# Patient Record
Sex: Male | Born: 1970 | Race: White | Hispanic: No | Marital: Single | Smoking: Current every day smoker
Health system: Southern US, Community
[De-identification: ages and names within clinical notes are randomized; demographics above are authoritative.]

## PROBLEM LIST (undated history)

## (undated) DIAGNOSIS — I10 Essential (primary) hypertension: Secondary | ICD-10-CM

## (undated) DIAGNOSIS — I2510 Atherosclerotic heart disease of native coronary artery without angina pectoris: Secondary | ICD-10-CM

## (undated) DIAGNOSIS — I2699 Other pulmonary embolism without acute cor pulmonale: Secondary | ICD-10-CM

## (undated) DIAGNOSIS — S8991XA Unspecified injury of right lower leg, initial encounter: Secondary | ICD-10-CM

## (undated) HISTORY — DX: Atherosclerotic heart disease of native coronary artery without angina pectoris: I25.10

## (undated) HISTORY — DX: Unspecified injury of right lower leg, initial encounter: S89.91XA

## (undated) HISTORY — DX: Other pulmonary embolism without acute cor pulmonale: I26.99

---

## 2000-11-28 ENCOUNTER — Emergency Department (HOSPITAL_COMMUNITY): Admission: EM | Admit: 2000-11-28 | Discharge: 2000-11-28 | Payer: Self-pay | Admitting: Emergency Medicine

## 2003-09-22 ENCOUNTER — Emergency Department (HOSPITAL_COMMUNITY): Admission: EM | Admit: 2003-09-22 | Discharge: 2003-09-22 | Payer: Self-pay | Admitting: Emergency Medicine

## 2004-12-12 ENCOUNTER — Emergency Department (HOSPITAL_COMMUNITY): Admission: EM | Admit: 2004-12-12 | Discharge: 2004-12-12 | Payer: Self-pay | Admitting: Emergency Medicine

## 2012-03-27 ENCOUNTER — Emergency Department (HOSPITAL_COMMUNITY): Payer: Managed Care, Other (non HMO)

## 2012-03-27 ENCOUNTER — Inpatient Hospital Stay (HOSPITAL_COMMUNITY)
Admission: EM | Admit: 2012-03-27 | Discharge: 2012-03-30 | DRG: 176 | Disposition: A | Discharge status: Home / Self Care | Payer: Managed Care, Other (non HMO) | Attending: Internal Medicine | Admitting: Internal Medicine

## 2012-03-27 ENCOUNTER — Encounter (HOSPITAL_COMMUNITY): Payer: Self-pay

## 2012-03-27 DIAGNOSIS — I2699 Other pulmonary embolism without acute cor pulmonale: Principal | ICD-10-CM

## 2012-03-27 DIAGNOSIS — F1010 Alcohol abuse, uncomplicated: Secondary | ICD-10-CM

## 2012-03-27 DIAGNOSIS — M19012 Primary osteoarthritis, left shoulder: Secondary | ICD-10-CM

## 2012-03-27 DIAGNOSIS — Z720 Tobacco use: Secondary | ICD-10-CM

## 2012-03-27 DIAGNOSIS — F17200 Nicotine dependence, unspecified, uncomplicated: Secondary | ICD-10-CM | POA: Diagnosis present

## 2012-03-27 DIAGNOSIS — M19019 Primary osteoarthritis, unspecified shoulder: Secondary | ICD-10-CM | POA: Diagnosis present

## 2012-03-27 LAB — BASIC METABOLIC PANEL
BUN: 15 mg/dL (ref 6–23)
Calcium: 8.9 mg/dL (ref 8.4–10.5)
Creatinine, Ser: 0.86 mg/dL (ref 0.50–1.35)
GFR calc Af Amer: >90 mL/min (ref >90)
GFR calc non Af Amer: >90 mL/min (ref >90)
Glucose, Bld: 102 mg/dL — ABNORMAL HIGH (ref 70–99)
Sodium: 136 mEq/L (ref 135–145)

## 2012-03-27 LAB — D-DIMER, QUANTITATIVE: D-Dimer, Quant: 0.7 ug/mL-FEU — ABNORMAL HIGH (ref 0.00–0.48)

## 2012-03-27 LAB — CBC WITH DIFFERENTIAL/PLATELET
Eosinophils Absolute: 0.1 10*3/uL (ref 0.0–0.7)
Hemoglobin: 15.4 g/dL (ref 13.0–17.0)
Lymphs Abs: 2.5 10*3/uL (ref 0.7–4.0)
MCH: 34.1 pg — ABNORMAL HIGH (ref 26.0–34.0)
Monocytes Relative: 6 % (ref 3–12)
Neutrophils Relative %: 76 % (ref 43–77)
RBC: 4.52 MIL/uL (ref 4.22–5.81)
WBC: 14.6 10*3/uL — ABNORMAL HIGH (ref 4.0–10.5)

## 2012-03-27 LAB — PROTIME-INR: Prothrombin Time: 11.9 seconds (ref 11.6–15.2)

## 2012-03-27 LAB — APTT: aPTT: 28 seconds (ref 24–37)

## 2012-03-27 MED ORDER — IOHEXOL 350 MG/ML SOLN
100.0000 mL | Freq: Once | INTRAVENOUS | Status: AC | PRN
  Administered 2012-03-27: 100 mL via INTRAVENOUS

## 2012-03-27 MED ORDER — SODIUM CHLORIDE 0.9 % IV BOLUS (SEPSIS)
500.0000 mL | Freq: Once | INTRAVENOUS | Status: AC
  Administered 2012-03-27: 500 mL via INTRAVENOUS

## 2012-03-27 MED ORDER — OXYCODONE-ACETAMINOPHEN 5-325 MG PO TABS
1.0000 | ORAL_TABLET | Freq: Once | ORAL | Status: AC
  Administered 2012-03-27: 1 via ORAL
  Filled 2012-03-27: qty 1

## 2012-03-27 NOTE — ED Provider Notes (Signed)
History   This chart was scribed for [employer]. Rubin Payor, MD by Leone Payor, ED Scribe. This patient was seen in room APA10/[facility]10 and the patient's care was started at 2023.   CSN: [id number]  Arrival date & time 03/27/12  2005   First MD Initiated Contact with Patient 03/27/12 2023      Chief Complaint  Patient presents with  . Chest Pain     The history is provided by the patient. No language interpreter was used.    Thomas Mckenzie is a 41 y.o. male who presents to the Emergency Department complaining of a new, unchanged, constant,  moderate right sided chest pain starting about 12 hours ago. Pt denies falling, any injuries, or any strenuous activities prior to onset of pain. He has not had similar symptoms in the past. Pt states that pain is aggravated by deep breaths. He denies any cough, fever, swelling in the extremities.      Pt is a current everyday smoker and occasional alcohol user. History reviewed. No pertinent past medical history.  History reviewed. No pertinent past surgical history.  History reviewed. No pertinent family history.  History  Substance Use Topics  . Smoking status: Current Every Day Smoker -- 2.0 packs/day    Types: Cigarettes  . Smokeless tobacco: Not on file  . Alcohol Use: Yes      Review of Systems  Constitutional: Negative.  Negative for fever.  HENT: Negative.   Respiratory: Negative for cough.   Cardiovascular: Positive for chest pain (right sided). Negative for leg swelling.  Gastrointestinal: Negative.   Musculoskeletal: Negative.   Skin: Negative.   Neurological: Negative.   Hematological: Negative.   Psychiatric/Behavioral: Negative.     Allergies  Review of patient's allergies indicates no known allergies.  Home Medications   Current Outpatient Rx  Name  Route  Sig  Dispense  Refill  . HYDROCODONE-ACETAMINOPHEN 7.5-325 MG PO TABS   Oral   Take 1 tablet by mouth every 6 (six) hours as needed. Pain         .  NAPROXEN 500 MG PO TABS   Oral   Take 500 mg by mouth 2 (two) times daily.         Marland Kitchen PRESCRIPTION MEDICATION   Injection   Inject as directed once. Cortisone injection in left shoulder done by Dr. Hilda Lias on Dec 3.           BP 156/94  Pulse 90  Temp 98.4 F (36.9 C) (Oral)  Resp 22  Ht 5\' 10"  (1.778 m)  Wt 190 lb (86.183 kg)  BMI 27.26 kg/m2  SpO2 98%  Physical Exam  Nursing note and vitals reviewed. Constitutional: He appears well-developed and well-nourished.  HENT:  Head: Normocephalic and atraumatic.  Eyes: Conjunctivae normal are normal. Pupils are equal, round, and reactive to light.  Neck: Neck supple. No tracheal deviation present. No thyromegaly present.  Cardiovascular: Normal rate, regular rhythm and normal heart sounds.  Exam reveals no friction rub.   No murmur heard.      Heart is normal, no murmurs or rub.   Pulmonary/Chest: Effort normal and breath sounds normal. He exhibits no tenderness.       Lungs are clear, no wheezes, rales. No chest tenderness.   Abdominal: Soft. Bowel sounds are normal. He exhibits no distension. There is no tenderness.  Musculoskeletal: Normal range of motion. He exhibits no edema and no tenderness.       No edema to the extremities.  Neurological: He is alert. Coordination normal.  Skin: Skin is warm and dry. No rash noted.       No rash.   Psychiatric: He has a normal mood and affect.    ED Course  Procedures (including critical care time)   COORDINATION OF CARE:  8:30 PM Discussed treatment plan which includes blood work and x-ray with pt at bedside and pt agreed to plan.  Results for orders placed during the hospital encounter of 03/27/12  D-DIMER, QUANTITATIVE      Component Value Range   D-Dimer, Quant 0.70 (*) 0.00 - 0.48 ug/mL-FEU   Dg Chest 2 View  03/27/2012  *RADIOLOGY REPORT*  Clinical Data: Right-sided chest pain.  CHEST - 2 VIEW  Comparison: None.  Findings: Lungs are hypoinflated without  consolidation or effusion. Cardiomediastinal silhouette, bones and soft tissues are within normal.  IMPRESSION: No acute cardiopulmonary disease.   Original Report Authenticated By: Elberta Fortis, M.D.       Labs Reviewed  D-DIMER, QUANTITATIVE - Abnormal; Notable for the following:    D-Dimer, Quant 0.70 (*)     All other components within normal limits  CBC WITH DIFFERENTIAL  BASIC METABOLIC PANEL  PROTIME-INR  APTT  ANTITHROMBIN III  PROTEIN C ACTIVITY  PROTEIN C, TOTAL  PROTEIN S ACTIVITY  PROTEIN S, TOTAL  LUPUS ANTICOAGULANT PANEL  BETA-2-GLYCOPROTEIN I ABS, IGG/M/A  HOMOCYSTEINE  FACTOR 5 LEIDEN  CARDIOLIPIN ANTIBODIES, IGG, IGM, IGA   Dg Chest 2 View  03/27/2012  *RADIOLOGY REPORT*  Clinical Data: Right-sided chest pain.  CHEST - 2 VIEW  Comparison: None.  Findings: Lungs are hypoinflated without consolidation or effusion. Cardiomediastinal silhouette, bones and soft tissues are within normal.  IMPRESSION: No acute cardiopulmonary disease.   Original Report Authenticated By: Elberta Fortis, M.D.    Ct Angio Chest W/cm &/or Wo Cm  03/27/2012  *RADIOLOGY REPORT*  Clinical Data:  Right chest pain, elevated D-dimer, increasing pain with deep inspiration, past history smoking  CT ANGIOGRAPHY CHEST  Technique:  Multidetector CT imaging of the chest using the standard protocol during bolus administration of intravenous contrast. Multiplanar reconstructed images including MIPs were obtained and reviewed to evaluate the vascular anatomy.  Contrast: OMNIPAQUE IOHEXOL 350 MG/ML SOLN  Comparison: None  Findings: Aorta normal caliber without aneurysm or dissection. Visualized portion upper abdomen unremarkable. No thoracic adenopathy. Filling defect identified within a right middle lobe pulmonary artery compatible pulmonary embolism. No other definite pulmonary emboli visualized. Scattered atelectasis right lower lobe. Peripheral infiltrate in the right middle lobe within the segment  served by the pulmonary embolus question infarct. Remaining lungs clear. No pleural effusion or pneumothorax. No acute osseous findings.  IMPRESSION: Pulmonary embolus identified within a right middle lobe pulmonary artery. Associated infiltrate in the same segment of the right middle lobe question infarct.  Critical Value/emergent results were called by telephone at the time of interpretation on 03/27/2012 at 2253 hours to Dr. Rubin Payor, who verbally acknowledged these results.   Original Report Authenticated By: Ulyses Southward, M.D.      1. Pulmonary embolism       MDM  Patient with right-sided chest pain. Acute onset. It is pleuritic. He is not hypoxic. D-dimer was positive. CT angiography she showed a pulmonary embolism. Also possible infarct. Patient be admitted to medicine. His primary care doctor the has not seen yet. He had a recent left shoulder injection also.      I personally performed the services described in this documentation, which was scribed in  my presence. The recorded information has been reviewed and is accurate.     Juliet Rude. Rubin Payor, MD 03/27/12 2308

## 2012-03-27 NOTE — ED Notes (Signed)
Pt c/o right sided chest pain that increases with deep breath.

## 2012-03-28 ENCOUNTER — Encounter (HOSPITAL_COMMUNITY): Payer: Self-pay | Admitting: Internal Medicine

## 2012-03-28 DIAGNOSIS — M19019 Primary osteoarthritis, unspecified shoulder: Secondary | ICD-10-CM

## 2012-03-28 DIAGNOSIS — F17200 Nicotine dependence, unspecified, uncomplicated: Secondary | ICD-10-CM

## 2012-03-28 DIAGNOSIS — I2699 Other pulmonary embolism without acute cor pulmonale: Secondary | ICD-10-CM | POA: Diagnosis present

## 2012-03-28 DIAGNOSIS — F1010 Alcohol abuse, uncomplicated: Secondary | ICD-10-CM

## 2012-03-28 DIAGNOSIS — Z720 Tobacco use: Secondary | ICD-10-CM | POA: Diagnosis present

## 2012-03-28 DIAGNOSIS — M19012 Primary osteoarthritis, left shoulder: Secondary | ICD-10-CM | POA: Diagnosis present

## 2012-03-28 LAB — BASIC METABOLIC PANEL
BUN: 13 mg/dL (ref 6–23)
CO2: 25 mEq/L (ref 19–32)
Chloride: 104 mEq/L (ref 96–112)
Glucose, Bld: 96 mg/dL (ref 70–99)
Potassium: 3.8 mEq/L (ref 3.5–5.1)
Sodium: 136 mEq/L (ref 135–145)

## 2012-03-28 LAB — CBC
HCT: 39 % (ref 39.0–52.0)
Hemoglobin: 13.7 g/dL (ref 13.0–17.0)
MCH: 33.7 pg (ref 26.0–34.0)
MCHC: 35.1 g/dL (ref 30.0–36.0)
RBC: 4.06 MIL/uL — ABNORMAL LOW (ref 4.22–5.81)

## 2012-03-28 LAB — HEPATIC FUNCTION PANEL
ALT: 25 U/L (ref 0–53)
AST: 20 U/L (ref 0–37)
Alkaline Phosphatase: 101 U/L (ref 39–117)
Bilirubin, Direct: <0.1 mg/dL (ref 0.0–0.3)
Total Bilirubin: 0.5 mg/dL (ref 0.3–1.2)

## 2012-03-28 LAB — URINALYSIS, ROUTINE W REFLEX MICROSCOPIC
Bilirubin Urine: NEGATIVE
Glucose, UA: NEGATIVE mg/dL
Specific Gravity, Urine: 1.01 (ref 1.005–1.030)
pH: 6.5 (ref 5.0–8.0)

## 2012-03-28 LAB — URINE MICROSCOPIC-ADD ON

## 2012-03-28 LAB — HEMOGLOBIN A1C: Hgb A1c MFr Bld: 5.3 % (ref <5.7)

## 2012-03-28 LAB — HOMOCYSTEINE: Homocysteine: 11.9 umol/L (ref 4.0–15.4)

## 2012-03-28 MED ORDER — TAB-A-VITE/IRON PO TABS
1.0000 | ORAL_TABLET | Freq: Every day | ORAL | Status: DC
  Administered 2012-03-28 – 2012-03-30 (×3): 1 via ORAL
  Filled 2012-03-28 (×4): qty 1

## 2012-03-28 MED ORDER — WARFARIN SODIUM 10 MG PO TABS
10.0000 mg | ORAL_TABLET | Freq: Once | ORAL | Status: AC
  Administered 2012-03-28: 10 mg via ORAL
  Filled 2012-03-28: qty 1

## 2012-03-28 MED ORDER — PANTOPRAZOLE SODIUM 40 MG PO TBEC
40.0000 mg | DELAYED_RELEASE_TABLET | Freq: Every day | ORAL | Status: DC
  Administered 2012-03-28 – 2012-03-30 (×3): 40 mg via ORAL
  Filled 2012-03-28 (×3): qty 1

## 2012-03-28 MED ORDER — POTASSIUM CHLORIDE IN NACL 20-0.9 MEQ/L-% IV SOLN
INTRAVENOUS | Status: DC
  Administered 2012-03-28 – 2012-03-29 (×3): via INTRAVENOUS

## 2012-03-28 MED ORDER — ENOXAPARIN SODIUM 120 MG/0.8ML ~~LOC~~ SOLN
120.0000 mg | SUBCUTANEOUS | Status: DC
  Administered 2012-03-28 – 2012-03-30 (×3): 120 mg via SUBCUTANEOUS
  Filled 2012-03-28 (×5): qty 0.8

## 2012-03-28 MED ORDER — HYDROCODONE-ACETAMINOPHEN 5-325 MG PO TABS
1.0000 | ORAL_TABLET | Freq: Four times a day (QID) | ORAL | Status: DC | PRN
Start: 2012-03-28 — End: 2012-03-29
  Administered 2012-03-28 – 2012-03-29 (×2): 1 via ORAL
  Filled 2012-03-28 (×2): qty 1

## 2012-03-28 MED ORDER — SORBITOL 70 % SOLN
30.0000 mL | Freq: Every day | Status: DC | PRN
  Filled 2012-03-28: qty 30

## 2012-03-28 MED ORDER — NAPROXEN 250 MG PO TABS
500.0000 mg | ORAL_TABLET | Freq: Two times a day (BID) | ORAL | Status: DC
  Administered 2012-03-28 (×2): 500 mg via ORAL
  Filled 2012-03-28 (×2): qty 2

## 2012-03-28 MED ORDER — WARFARIN VIDEO
Freq: Once | Status: AC
  Administered 2012-03-28: 10:00:00

## 2012-03-28 MED ORDER — ONDANSETRON HCL 4 MG/2ML IJ SOLN: 4.0000 mg | INTRAMUSCULAR | Status: DC | PRN

## 2012-03-28 MED ORDER — LEVOFLOXACIN IN D5W 500 MG/100ML IV SOLN
500.0000 mg | Freq: Once | INTRAVENOUS | Status: AC
  Administered 2012-03-28: 500 mg via INTRAVENOUS
  Filled 2012-03-28: qty 100

## 2012-03-28 MED ORDER — SODIUM CHLORIDE 0.9 % IV SOLN
INTRAVENOUS | Status: AC
  Administered 2012-03-28: 02:00:00 via INTRAVENOUS

## 2012-03-28 MED ORDER — WARFARIN - PHARMACIST DOSING INPATIENT: Freq: Every day | Status: DC

## 2012-03-28 MED ORDER — ENOXAPARIN SODIUM 60 MG/0.6ML ~~LOC~~ SOLN
1.0000 mg/kg | Freq: Once | SUBCUTANEOUS | Status: AC
  Administered 2012-03-28: 90 mg via SUBCUTANEOUS
  Filled 2012-03-28: qty 0.3

## 2012-03-28 MED ORDER — VITAMIN B-1 100 MG PO TABS
100.0000 mg | ORAL_TABLET | Freq: Every day | ORAL | Status: DC
  Administered 2012-03-28 – 2012-03-30 (×3): 100 mg via ORAL
  Filled 2012-03-28 (×3): qty 1

## 2012-03-28 MED ORDER — LEVOFLOXACIN 500 MG PO TABS
500.0000 mg | ORAL_TABLET | Freq: Every day | ORAL | Status: DC
  Administered 2012-03-29 – 2012-03-30 (×2): 500 mg via ORAL
  Filled 2012-03-28 (×2): qty 1

## 2012-03-28 MED ORDER — TRAZODONE HCL 50 MG PO TABS: 50.0000 mg | ORAL_TABLET | Freq: Every evening | ORAL | Status: DC | PRN

## 2012-03-28 MED ORDER — ENOXAPARIN SODIUM 40 MG/0.4ML ~~LOC~~ SOLN: 40.0000 mg | SUBCUTANEOUS | Status: DC

## 2012-03-28 MED ORDER — ONDANSETRON HCL 4 MG PO TABS: 4.0000 mg | ORAL_TABLET | Freq: Four times a day (QID) | ORAL | Status: DC | PRN

## 2012-03-28 MED ORDER — POLYETHYLENE GLYCOL 3350 17 G PO PACK: 17.0000 g | PACK | Freq: Every day | ORAL | Status: DC | PRN

## 2012-03-28 MED ORDER — COUMADIN BOOK
Freq: Once | Status: AC
  Administered 2012-03-28: 12:00:00
  Filled 2012-03-28: qty 1

## 2012-03-28 MED ORDER — LEVOFLOXACIN IN D5W 500 MG/100ML IV SOLN
INTRAVENOUS | Status: AC
  Filled 2012-03-28: qty 100

## 2012-03-28 MED ORDER — MAGNESIUM CITRATE PO SOLN: 1.0000 | Freq: Once | ORAL | Status: AC | PRN

## 2012-03-28 MED ORDER — ACETAMINOPHEN 325 MG PO TABS: 650.0000 mg | ORAL_TABLET | ORAL | Status: DC | PRN

## 2012-03-28 NOTE — ED Notes (Signed)
Dr Orvan Falconer with pt now

## 2012-03-28 NOTE — H&P (Signed)
[facility] History and Physical  Thomas Mckenzie  GEX:[id number]  DOB: 04-07-1971   DOA: 03/28/2012   PCP:   No primary provider on file.  None: Orthopedic surgeon: Dr. Hilda Lias  Chief Complaint:  Right-sided chest pain with breathing since this morning  HPI: Thomas Mckenzie is an 41 y.o. male.   Athletic young Caucasian gentleman, with heavy tobacco and alcohol, considers himself to be in good health until this morning when he developed a sharp low right-sided chest pain aggravated by breathing. He denies cough shortness of breath or palpitations; denies long-distance travel, or any sensory behavior; In fact he has no obvious risk factors for DVT.   CT angiogram in the emergency room revealed right middle lobe tumor embolus with infarct  Smokes 2 packs per day;  Drinks half a gallon of vodka per week; Works as a Electronics engineer  Has been taking naproxen and Percocet for left shoulder arthritis.  Rewiew of Systems:   All systems negative except as marked bold or noted in the HPI;  Constitutional: Negative for malaise, fever and chills. ;  Eyes: Negative for eye pain, redness and discharge. ;  ENMT: Negative for ear pain, hoarseness, nasal congestion, sinus pressure and sore throat. ;  Cardiovascular: Negative for , palpitations, diaphoresis, dyspnea and peripheral edema. ;  Respiratory: Negative for cough, hemoptysis, wheezing and stridor. ;  Gastrointestinal: Negative for nausea, vomiting, diarrhea, constipation, abdominal pain, melena, blood in stool, hematemesis, jaundice and rectal bleeding. unusual weight loss..   Genitourinary: Negative for frequency, dysuria, incontinence,flank pain and hematuria; Musculoskeletal: Negative for back pain and neck pain. Negative for swelling and trauma.;  Skin: . Negative for pruritus, rash, abrasions, bruising and skin lesion.; ulcerations Neuro: Negative for headache, lightheadedness and neck stiffness. Negative for weakness, altered level of  consciousness , altered mental status, extremity weakness, burning feet, involuntary movement, seizure and syncope.  Psych: negative for anxiety, depression, insomnia, tearfulness, panic attacks, hallucinations, paranoia, suicidal or homicidal ideation    History reviewed. No pertinent past medical history.  History reviewed. No pertinent past surgical history.  Medications:  HOME MEDS: Prior to Admission medications   Medication Sig Start Date End Date Taking? Authorizing Provider  HYDROcodone-acetaminophen (NORCO) 7.5-325 MG per tablet Take 1 tablet by mouth every 6 (six) hours as needed. Pain   Yes Historical Provider, MD  naproxen (NAPROSYN) 500 MG tablet Take 500 mg by mouth 2 (two) times daily.   Yes Historical Provider, MD  PRESCRIPTION MEDICATION Inject as directed once. Cortisone injection in left shoulder done by Dr. Hilda Lias on Dec 3.   Yes Historical Provider, MD     Allergies:  No Known Allergies  Social History:   reports that he has been smoking Cigarettes.  He has a 40 pack-year smoking history. He quit smokeless tobacco use about 4 years ago. His smokeless tobacco use included Chew. He reports that he drinks alcohol. He reports that he does not use illicit drugs.  Family History: Family History  Problem Relation Age of Onset  . Cancer Father     liver  . Coronary artery disease Father      Physical Exam: Filed Vitals:   03/27/12 2323 03/27/12 2330 03/28/12 0030 03/28/12 0142  BP: 137/96 140/92 127/87 132/88  Pulse:  75 71 70  Temp:    98.2 F (36.8 C)  TempSrc:    Oral  Resp: 19 23 20 18   Height:    5\' 10"  (1.778 m)  Weight:  SpO2:  96% 95% 98%   Blood pressure 132/88, pulse 70, temperature 98.2 F (36.8 C), temperature source Oral, resp. rate 18, height 5\' 10"  (1.778 m), weight 86.183 kg (190 lb), SpO2 98.00%.  GEN:  Pleasant pleasant young Caucasian gentleman in the stretcher; appears no acute distress, but also seems quite stoic; cooperative  with exam PSYCH:  alert and oriented x4; affect is appropriate. HEENT: Mucous membranes pink and anicteric; PERRLA; EOM intact; no cervical lymphadenopathy nor thyromegaly or carotid bruit; no JVD; Breasts:: Not examined CHEST WALL: No tenderness CHEST: Normal respiration, clear to auscultation bilaterally HEART: Regular rate and rhythm; no murmurs rubs or gallops BACK: No kyphosis or scoliosis; no CVA tenderness ABDOMEN:  soft non-tender; no masses, no organomegaly, normal abdominal bowel sounds; no pannus; no intertriginous candida. Rectal Exam: Not done EXTREMITIES: No bone or joint deformity; age-appropriate arthropathy of the hands and knees; no edema; no ulcerations. Healed abrasions of left calf status post a recent dog bite Genitalia: not examined PULSES: 2+ and symmetric SKIN: Normal hydration no rash or ulceration CNS: Cranial nerves 2-12 grossly intact no focal lateralizing neurologic deficit   Labs on Admission:  Basic Metabolic Panel:  Lab 03/27/12 1324  NA 136  K 3.8  CL 100  CO2 26  GLUCOSE 102*  BUN 15  CREATININE 0.86  CALCIUM 8.9  MG --  PHOS --   Liver Function Tests: No results found for this basename: AST:5,ALT:5,ALKPHOS:5,BILITOT:5,PROT:5,ALBUMIN:5 in the last 168 hours No results found for this basename: LIPASE:5,AMYLASE:5 in the last 168 hours No results found for this basename: AMMONIA:5 in the last 168 hours CBC:  Lab 03/27/12 2305  WBC 14.6*  NEUTROABS 11.1*  HGB 15.4  HCT 43.5  MCV 96.2  PLT 211   Cardiac Enzymes: No results found for this basename: CKTOTAL:5,CKMB:5,CKMBINDEX:5,TROPONINI:5 in the last 168 hours BNP: No components found with this basename: POCBNP:5 D-dimer: No components found with this basename: D-DIMER:5 CBG: No results found for this basename: GLUCAP:5 in the last 168 hours  Radiological Exams on Admission: Dg Chest 2 View  03/27/2012  *RADIOLOGY REPORT*  Clinical Data: Right-sided chest pain.  CHEST - 2 VIEW   Comparison: None.  Findings: Lungs are hypoinflated without consolidation or effusion. Cardiomediastinal silhouette, bones and soft tissues are within normal.  IMPRESSION: No acute cardiopulmonary disease.   Original Report Authenticated By: Elberta Fortis, M.D.    Ct Angio Chest W/cm &/or Wo Cm  03/27/2012  *RADIOLOGY REPORT*  Clinical Data:  Right chest pain, elevated D-dimer, increasing pain with deep inspiration, past history smoking  CT ANGIOGRAPHY CHEST  Technique:  Multidetector CT imaging of the chest using the standard protocol during bolus administration of intravenous contrast. Multiplanar reconstructed images including MIPs were obtained and reviewed to evaluate the vascular anatomy.  Contrast: OMNIPAQUE IOHEXOL 350 MG/ML SOLN  Comparison: None  Findings: Aorta normal caliber without aneurysm or dissection. Visualized portion upper abdomen unremarkable. No thoracic adenopathy. Filling defect identified within a right middle lobe pulmonary artery compatible pulmonary embolism. No other definite pulmonary emboli visualized. Scattered atelectasis right lower lobe. Peripheral infiltrate in the right middle lobe within the segment served by the pulmonary embolus question infarct. Remaining lungs clear. No pleural effusion or pneumothorax. No acute osseous findings.  IMPRESSION: Pulmonary embolus identified within a right middle lobe pulmonary artery. Associated infiltrate in the same segment of the right middle lobe question infarct.  Critical Value/emergent results were called by telephone at the time of interpretation on 03/27/2012 at 2253 hours  to Dr. Rubin Payor, who verbally acknowledged these results.   Original Report Authenticated By: Ulyses Southward, M.D.     EKG: Independently reviewed. Normal sinus rhythm no acute abnormalities   Assessment/Plan Present on Admission:  . Pulmonary embolism, with infarct   Start Lovenox and Coumadin; explained the treatment plan and options; add Levaquin  because of the infarct  . Arthritis of left shoulder region  Continue naproxen and Percocet cautiously;; will add a proton pump inhibitor  . Tobacco abuse  Consult on tobacco smoking as a risk factor for DVT, and multiple other illnesses; patient declines a nicotine patch  . Alcohol abuse  Denies withdrawal symptoms with abstinence, we'll Monitor for signs of withdrawal in hospital    Other plans as per orders.  Code Status:FULL CODE   Disposition Plan: Discussed the possibility of inpatient management for 5 days versus being discharged home with Lovenox and Coumadin; he has no PCP    Yoselin Amerman Nocturnist [facility] Pager [phone]   03/28/2012, 2:53 AM

## 2012-03-28 NOTE — ED Notes (Signed)
Pt declines need for any further pain med

## 2012-03-28 NOTE — Progress Notes (Addendum)
1610 Patient with positive D-dimer and positive PE by CT. Awaiting labs. For admission.   Results for orders placed during the hospital encounter of 03/27/12  D-DIMER, QUANTITATIVE      Component Value Range   D-Dimer, Quant 0.70 (*) 0.00 - 0.48 ug/mL-FEU  CBC WITH DIFFERENTIAL      Component Value Range   WBC 14.6 (*) 4.0 - 10.5 K/uL   RBC 4.52  4.22 - 5.81 MIL/uL   Hemoglobin 15.4  13.0 - 17.0 g/dL   HCT 96.0  45.4 - 09.8 %   MCV 96.2  78.0 - 100.0 fL   MCH 34.1 (*) 26.0 - 34.0 pg   MCHC 35.4  30.0 - 36.0 g/dL   RDW 11.9  14.7 - 82.9 %   Platelets 211  150 - 400 K/uL   Neutrophils Relative 76  43 - 77 %   Neutro Abs 11.1 (*) 1.7 - 7.7 K/uL   Lymphocytes Relative 17  12 - 46 %   Lymphs Abs 2.5  0.7 - 4.0 K/uL   Monocytes Relative 6  3 - 12 %   Monocytes Absolute 0.9  0.1 - 1.0 K/uL   Eosinophils Relative 1  0 - 5 %   Eosinophils Absolute 0.1  0.0 - 0.7 K/uL   Basophils Relative 0  0 - 1 %   Basophils Absolute 0.0  0.0 - 0.1 K/uL  BASIC METABOLIC PANEL      Component Value Range   Sodium 136  135 - 145 mEq/L   Potassium 3.8  3.5 - 5.1 mEq/L   Chloride 100  96 - 112 mEq/L   CO2 26  19 - 32 mEq/L   Glucose, Bld 102 (*) 70 - 99 mg/dL   BUN 15  6 - 23 mg/dL   Creatinine, Ser 5.62  0.50 - 1.35 mg/dL   Calcium 8.9  8.4 - 13.0 mg/dL   GFR calc non Af Amer >90  >90 mL/min   GFR calc Af Amer >90  >90 mL/min  PROTIME-INR      Component Value Range   Prothrombin Time 11.9  11.6 - 15.2 seconds   INR 0.88  0.00 - 1.49  APTT      Component Value Range   aPTT 28  24 - 37 seconds  Will initiate lovenox. Admission paperwork completed.Labs pending.

## 2012-03-28 NOTE — Progress Notes (Signed)
The patient is a 41 year old man with a history of tobacco and alcohol abuse who was admitted this morning for chest pain. He was found to have a right-sided pulmonary embolus. He was briefly seen. His chart, vitals signs, laboratory studies were reviewed. Agree with current management. We'll add multivitamin therapy and thiamine. We'll order a hypercoagulable panel, with some exceptions.

## 2012-03-28 NOTE — Progress Notes (Signed)
ANTICOAGULATION CONSULT NOTE - Initial Consult  Pharmacy Consult for Lovenox -->Coumadin Indication: pulmonary embolus  No Known Allergies  Patient Measurements: Height: 5\' 10"  (177.8 cm) Weight: 182 lb 5.1 oz (82.7 kg) IBW/kg (Calculated) : 73   Vital Signs: Temp: 97.2 F (36.2 C) (12/15 0516) Temp src: Oral (12/15 0516) BP: 111/70 mmHg (12/15 0516) Pulse Rate: 58  (12/15 0516)  Labs:  Basename 03/28/12 0436 03/27/12 2305  HGB 13.7 15.4  HCT 39.0 43.5  PLT 200 211  APTT -- 28  LABPROT -- 11.9  INR -- 0.88  HEPARINUNFRC -- --  CREATININE 0.79 0.86  CKTOTAL -- --  CKMB -- --  TROPONINI -- --    Estimated Creatinine Clearance: 126.7 ml/min (by C-G formula based on Cr of 0.79).   Medical History: History reviewed. No pertinent past medical history.  Medications:  Scheduled:    . sodium chloride   Intravenous STAT  . coumadin book   Does not apply Once  . enoxaparin (LOVENOX) injection  120 mg Subcutaneous Q24H  . [COMPLETED] enoxaparin  1 mg/kg Subcutaneous Once  . [COMPLETED] levofloxacin (LEVAQUIN) IV  500 mg Intravenous Once   Followed by  . levofloxacin  500 mg Oral Daily  . naproxen  500 mg Oral BID  . [COMPLETED] oxyCODONE-acetaminophen  1 tablet Oral Once  . pantoprazole  40 mg Oral Daily  . [COMPLETED] sodium chloride  500 mL Intravenous Once  . warfarin  10 mg Oral Once  . warfarin   Does not apply Once  . Warfarin - Pharmacist Dosing Inpatient   Does not apply q1800  . [DISCONTINUED] enoxaparin (LOVENOX) injection  40 mg Subcutaneous Q24H    Assessment: 41 yo M with new PE to start on Lovenox & Coumadin.  He received 90mg  of Lovenox at 0030.  Today is overlap day#1/5.  Coumadin score is 8. He has no hx of bleeding noted although does take scheduled naproxen for shoulder pain.   Goal of Therapy:  Anti-Xa level 0.6-1.2 units/ml 4hrs after LMWH dose given INR 2-3 Monitor platelets by anticoagulation protocol: Yes   Plan:  1) Lovenox 1.5mg /kg  q24h (120mg ) for a minimum of 5 days or until INR >2 x 24hrs. 2) Coumadin 10mg  po x 1 today 3) Daily INR 4) CBC on MWF 5) Initiate warfarin education.   Elson Clan 03/28/2012,8:33 AM

## 2012-03-29 LAB — PROTEIN C, TOTAL: Protein C, Total: 92 % (ref 72–160)

## 2012-03-29 LAB — BASIC METABOLIC PANEL
BUN: 13 mg/dL (ref 6–23)
Creatinine, Ser: 0.87 mg/dL (ref 0.50–1.35)
GFR calc non Af Amer: >90 mL/min (ref >90)
Glucose, Bld: 93 mg/dL (ref 70–99)
Potassium: 3.8 mEq/L (ref 3.5–5.1)

## 2012-03-29 LAB — CBC
HCT: 42.3 % (ref 39.0–52.0)
Hemoglobin: 14.5 g/dL (ref 13.0–17.0)
MCHC: 34.3 g/dL (ref 30.0–36.0)
RDW: 12.8 % (ref 11.5–15.5)
WBC: 9.4 10*3/uL (ref 4.0–10.5)

## 2012-03-29 LAB — PROTEIN C ACTIVITY: Protein C Activity: 166 % — ABNORMAL HIGH (ref 75–133)

## 2012-03-29 LAB — PROTIME-INR
INR: 1.05 (ref 0.00–1.49)
Prothrombin Time: 13.6 seconds (ref 11.6–15.2)

## 2012-03-29 LAB — LUPUS ANTICOAGULANT PANEL

## 2012-03-29 MED ORDER — WARFARIN SODIUM 10 MG PO TABS
10.0000 mg | ORAL_TABLET | Freq: Once | ORAL | Status: AC
  Administered 2012-03-29: 10 mg via ORAL
  Filled 2012-03-29: qty 1

## 2012-03-29 MED ORDER — SODIUM CHLORIDE 0.9 % IJ SOLN
3.0000 mL | Freq: Two times a day (BID) | INTRAMUSCULAR | Status: DC
  Administered 2012-03-29 – 2012-03-30 (×2): 3 mL via INTRAVENOUS

## 2012-03-29 MED ORDER — OXYCODONE HCL 5 MG PO TABS
5.0000 mg | ORAL_TABLET | ORAL | Status: DC | PRN
Start: 2012-03-29 — End: 2012-03-30
  Administered 2012-03-29 – 2012-03-30 (×4): 5 mg via ORAL
  Filled 2012-03-29 (×4): qty 1

## 2012-03-29 NOTE — Progress Notes (Signed)
ANTICOAGULATION CONSULT NOTE - Initial Consult  Pharmacy Consult for Lovenox -->Coumadin Indication: pulmonary embolus  No Known Allergies  Patient Measurements: Height: 5\' 10"  (177.8 cm) Weight: 181 lb 14.1 oz (82.5 kg) IBW/kg (Calculated) : 73   Vital Signs: Temp: 97.5 F (36.4 C) (12/16 0500) Temp src: Oral (12/16 0500) BP: 122/85 mmHg (12/16 0500) Pulse Rate: 62  (12/16 0500)  Labs:  Basename 03/29/12 0510 03/28/12 0436 03/27/12 2305  HGB 14.5 13.7 --  HCT 42.3 39.0 43.5  PLT 202 200 211  APTT -- -- 28  LABPROT 13.6 -- 11.9  INR 1.05 -- 0.88  HEPARINUNFRC -- -- --  CREATININE 0.87 0.79 0.86  CKTOTAL -- -- --  CKMB -- -- --  TROPONINI -- -- --    Estimated Creatinine Clearance: 116.5 ml/min (by C-G formula based on Cr of 0.87).   Medical History: History reviewed. No pertinent past medical history.  Medications:  Scheduled:     . [COMPLETED] sodium chloride   Intravenous STAT  . [COMPLETED] coumadin book   Does not apply Once  . enoxaparin (LOVENOX) injection  120 mg Subcutaneous Q24H  . levofloxacin  500 mg Oral Daily  . multivitamins with iron  1 tablet Oral Daily  . pantoprazole  40 mg Oral Daily  . sodium chloride  3 mL Intravenous Q12H  . thiamine  100 mg Oral Daily  . [COMPLETED] warfarin  10 mg Oral Once  . warfarin  10 mg Oral ONCE-1800  . [COMPLETED] warfarin   Does not apply Once  . Warfarin - Pharmacist Dosing Inpatient   Does not apply q1800  . [DISCONTINUED] naproxen  500 mg Oral BID    Assessment: 41 yo M with new PE on overlap day#2 of Lovenox & Coumadin.  Coumadin score is 8. INR rising to goal. No bleeding noted.   Goal of Therapy:  Anti-Xa level 0.6-1.2 units/ml 4hrs after LMWH dose given INR 2-3 Monitor platelets by anticoagulation protocol: Yes   Plan:  1) Lovenox 1.5mg /kg q24h (120mg ) for a minimum of 5 days or until INR >2 x 24hrs. 2) Coumadin 10mg  po x 1 today 3) Daily INR 4) CBC on MWF 5) Warfarin education.    Elson Clan 03/29/2012,9:21 AM

## 2012-03-29 NOTE — Progress Notes (Signed)
Subjective: The patient has mild pleuritic right-sided chest pain. He has no complaints of shortness of breath. He denies anxiousness or jitteriness.  Objective: Vital signs in last 24 hours: Filed Vitals:   03/28/12 1306 03/28/12 2028 03/29/12 0238 03/29/12 0500  BP: 132/79 128/80 107/76 122/85  Pulse: 67 62 69 62  Temp: 97.9 F (36.6 C) 98.4 F (36.9 C) 97.8 F (36.6 C) 97.5 F (36.4 C)  TempSrc: Oral Oral Oral Oral  Resp: 20 19 18 17   Height:      Weight:    82.5 kg (181 lb 14.1 oz)  SpO2: 98% 99% 97% 97%    Intake/Output Summary (Last 24 hours) at 03/29/12 1443 Last data filed at 03/28/12 1831  Gross per 24 hour  Intake    600 ml  Output      0 ml  Net    600 ml    Weight change: -3.683 kg (-8 lb 1.9 oz)  Physical exam: General: Pleasant 41 year old Caucasian man sitting up in bed, in no acute distress. His girlfriend is in the room. Questions answered. Lungs: Mild splinting. Lungs are mostly clear. Breathing nonlabored at rest. Heart: S1, S2, with no murmurs rubs or gallops. Abdomen: Positive bowel sounds, soft, nontender, nondistended. Extremities: No pedal edema. Psychiatric/neurologic: He has a pleasant affect. He is alert and oriented x3. Cranial nerves II through XII are intact. No evidence of tremulousness.  Lab Results: Basic Metabolic Panel:  Basename 03/29/12 0510 03/28/12 0436  NA 140 136  K 3.8 3.8  CL 105 104  CO2 27 25  GLUCOSE 93 96  BUN 13 13  CREATININE 0.87 0.79  CALCIUM 9.1 8.3*  MG -- --  PHOS -- --   Liver Function Tests:  [facility] 03/27/12 2042  AST 20  ALT 25  ALKPHOS 101  BILITOT 0.5  PROT 7.2  ALBUMIN 4.2   No results found for this basename: LIPASE:2,AMYLASE:2 in the last 72 hours No results found for this basename: AMMONIA:2 in the last 72 hours CBC:  Basename 03/29/12 0510 03/28/12 0436 03/27/12 2305  WBC 9.4 11.9* --  NEUTROABS -- -- 11.1*  HGB 14.5 13.7 --  HCT 42.3 39.0 --  MCV 97.7 96.1 --  PLT 202 200 --    Cardiac Enzymes: No results found for this basename: CKTOTAL:3,CKMB:3,CKMBINDEX:3,TROPONINI:3 in the last 72 hours BNP: No results found for this basename: PROBNP:3 in the last 72 hours D-Dimer:  Alvira Philips 03/27/12 2052  DDIMER 0.70*   CBG: No results found for this basename: GLUCAP:6 in the last 72 hours Hemoglobin A1C:  Basename 03/27/12 2042  HGBA1C 5.3   Fasting Lipid Panel: No results found for this basename: CHOL,HDL,LDLCALC,TRIG,CHOLHDL,LDLDIRECT in the last 72 hours Thyroid Function Tests:  [facility] 03/27/12 2042  TSH 4.015  T4TOTAL --  FREET4 --  T3FREE --  THYROIDAB --   Anemia Panel: No results found for this basename: VITAMINB12,FOLATE,FERRITIN,TIBC,IRON,RETICCTPCT in the last 72 hours Coagulation:  Basename 03/29/12 0510 03/27/12 2305  LABPROT 13.6 11.9  INR 1.05 0.88   Urine Drug Screen: Drugs of Abuse  No results found for this basename: labopia, cocainscrnur, labbenz, amphetmu, thcu, labbarb    Alcohol Level: No results found for this basename: ETH:2 in the last 72 hours Urinalysis:  Basename 03/28/12 0638  COLORURINE YELLOW  LABSPEC 1.010  PHURINE 6.5  GLUCOSEU NEGATIVE  HGBUR TRACE*  BILIRUBINUR NEGATIVE  KETONESUR NEGATIVE  PROTEINUR NEGATIVE  UROBILINOGEN 0.2  NITRITE NEGATIVE  LEUKOCYTESUR NEGATIVE   Misc. Labs:   Micro:  No results found for this or any previous visit (from the past 240 hour(s)).  Studies/Results: Dg Chest 2 View  03/27/2012  *RADIOLOGY REPORT*  Clinical Data: Right-sided chest pain.  CHEST - 2 VIEW  Comparison: None.  Findings: Lungs are hypoinflated without consolidation or effusion. Cardiomediastinal silhouette, bones and soft tissues are within normal.  IMPRESSION: No acute cardiopulmonary disease.   Original Report Authenticated By: Elberta Fortis, M.D.    Ct Angio Chest W/cm &/or Wo Cm  03/27/2012  *RADIOLOGY REPORT*  Clinical Data:  Right chest pain, elevated D-dimer, increasing pain with deep  inspiration, past history smoking  CT ANGIOGRAPHY CHEST  Technique:  Multidetector CT imaging of the chest using the standard protocol during bolus administration of intravenous contrast. Multiplanar reconstructed images including MIPs were obtained and reviewed to evaluate the vascular anatomy.  Contrast: OMNIPAQUE IOHEXOL 350 MG/ML SOLN  Comparison: None  Findings: Aorta normal caliber without aneurysm or dissection. Visualized portion upper abdomen unremarkable. No thoracic adenopathy. Filling defect identified within a right middle lobe pulmonary artery compatible pulmonary embolism. No other definite pulmonary emboli visualized. Scattered atelectasis right lower lobe. Peripheral infiltrate in the right middle lobe within the segment served by the pulmonary embolus question infarct. Remaining lungs clear. No pleural effusion or pneumothorax. No acute osseous findings.  IMPRESSION: Pulmonary embolus identified within a right middle lobe pulmonary artery. Associated infiltrate in the same segment of the right middle lobe question infarct.  Critical Value/emergent results were called by telephone at the time of interpretation on 03/27/2012 at 2253 hours to Dr. Rubin Payor, who verbally acknowledged these results.   Original Report Authenticated By: Ulyses Southward, M.D.     Medications:  Scheduled:   . enoxaparin (LOVENOX) injection  120 mg Subcutaneous Q24H  . levofloxacin  500 mg Oral Daily  . multivitamins with iron  1 tablet Oral Daily  . pantoprazole  40 mg Oral Daily  . sodium chloride  3 mL Intravenous Q12H  . thiamine  100 mg Oral Daily  . warfarin  10 mg Oral ONCE-1800  . Warfarin - Pharmacist Dosing Inpatient   Does not apply q1800   Continuous:  HYQ:MVHQIONGEXBMW, ondansetron (ZOFRAN) IV, oxyCODONE, polyethylene glycol, sorbitol, traZODone  Assessment: Active Problems:  Pulmonary embolism  Arthritis of left shoulder region  Tobacco abuse  Alcohol abuse   1. Pulmonary embolism.  Etiology unknown. No known risk factors other than alcohol and tobacco abuse. Continue Lovenox and Coumadin. Some hypercoagulable laboratory studies have been ordered and pending.  Tobacco abuse. The patient was advised to stop smoking. He refuses nicotine replacement therapy.   Alcohol abuse. It sounds as if he is more of a binge drinker. No signs of alcohol withdrawal syndrome. He was advised to stop drinking. He does not believe he has a problem with alcohol abuse. Continue vitamin therapy.  Chronic osteoarthritis of the left shoulder. Naproxen is being held because of anticoagulation. We'll treat his pain with as needed opiates.  Plan:  1. Continue Lovenox and Coumadin. Discontinue Lovenox when his INR is between 2 and 3. 2. The patient has no current PCP, but apparently a skin appointment with his girlfriend's PCP in [place]. He is comfortable with giving himself Lovenox injections, consider discharge to home with self Lovenox injections tomorrow.    LOS: 2 days   Juliza Machnik 03/29/2012, 2:43 PM

## 2012-03-29 NOTE — Care Management Note (Signed)
    Page 1 of 1   03/30/2012     12:11:31 PM   CARE MANAGEMENT NOTE 03/30/2012  Patient:  Thomas Mckenzie, Thomas Mckenzie   Account Number:  [id number]  Date Initiated:  03/29/2012  Documentation initiated by:  Rosemary Holms  Subjective/Objective Assessment:   Pt admitted from home where he lives with his girlfriend Clarisa Kindred. Per pt, Dr Olena Leatherwood in [place] will be seeing him 04/27/12. GF called and was told that the pt could be seen sooner.     Action/Plan:   RCM called Dr. Olena Leatherwood. There is an appt on their schedule for this Thursday to see MD. If too soon, will need to reschedule.   Anticipated DC Date:  03/29/2012   Anticipated DC Plan:  HOME/SELF CARE      DC Planning Services  CM consult      Choice offered to / List presented to:             Status of service:  Completed, signed off Medicare Important Message given?   (If response is "NO", the following Medicare IM given date fields will be blank) Date Medicare IM given:   Date Additional Medicare IM given:    Discharge Disposition:  HOME/SELF CARE  Per UR Regulation:    If discussed at Long Length of Stay Meetings, dates discussed:    Comments:  03/30/12 Rosemary Holms RN BSN CM Pt given Coumadin video and has been given lovenox training. Does not believe he will need HH. Appointment with PCP will resume.  03/29/12 Rosemary Holms RN BSN CM

## 2012-03-29 NOTE — Progress Notes (Signed)
UR Chart Review Completed  

## 2012-03-30 LAB — BETA-2-GLYCOPROTEIN I ABS, IGG/M/A: Beta-2-Glycoprotein I IgM: 11 M Units (ref <20)

## 2012-03-30 LAB — CARDIOLIPIN ANTIBODIES, IGG, IGM, IGA
Anticardiolipin IgA: 7 APL U/mL — ABNORMAL LOW (ref <22)
Anticardiolipin IgG: 4 GPL U/mL — ABNORMAL LOW (ref <23)
Anticardiolipin IgM: 9 MPL U/mL — ABNORMAL LOW (ref <11)

## 2012-03-30 LAB — FACTOR 5 LEIDEN

## 2012-03-30 MED ORDER — WARFARIN SODIUM 5 MG PO TABS: 5.0000 mg | ORAL_TABLET | Freq: Every day | ORAL | Status: DC

## 2012-03-30 MED ORDER — ENOXAPARIN SODIUM 120 MG/0.8ML ~~LOC~~ SOLN: 120.0000 mg | SUBCUTANEOUS | Status: DC

## 2012-03-30 MED ORDER — WARFARIN SODIUM 5 MG PO TABS: 5.0000 mg | ORAL_TABLET | Freq: Once | ORAL | Status: DC

## 2012-03-30 NOTE — Progress Notes (Signed)
Pt discharged home today per Dr. Memon. Pt's IV site D/C'd and WNL. Pt's VS stable at this time. Pt provided with home medication list, discharge instructions and prescriptions. Pt verbalized understanding. Pt left floor in stable condition accompanied by RN.  

## 2012-03-30 NOTE — Discharge Summary (Signed)
Physician Discharge Summary  Thomas Mckenzie ZOX:[id number] DOB: 1971/01/27 DOA: 03/27/2012  PCP: Toma Deiters, MD  Admit date: 03/27/2012 Discharge date: 03/30/2012  Time spent: 35 minutes  Recommendations for Outpatient Follow-up:  1. Patient will follow up with primary care doctor on Thursday for INR 2. He will be contacted by Cancer center for outpatient appointment and evaluation of hypercoagulable work up  Discharge Diagnoses:  Active Problems:  Pulmonary embolism  Arthritis of left shoulder region  Tobacco abuse  Alcohol abuse   Discharge Condition: improved  Diet recommendation: low salt  Filed Weights   03/28/12 0516 03/29/12 0500 03/30/12 0636  Weight: 82.7 kg (182 lb 5.1 oz) 82.5 kg (181 lb 14.1 oz) 81.6 kg (179 lb 14.3 oz)    History of present illness:  Thomas Mckenzie is an 41 y.o. male. Athletic young Caucasian gentleman, with heavy tobacco and alcohol, considers himself to be in good health until this morning when he developed a sharp low right-sided chest pain aggravated by breathing. He denies cough shortness of breath or palpitations; denies long-distance travel, or any sensory behavior;  In fact he has no obvious risk factors for DVT.  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   Hospital Course:  This gentleman was admitted to the hospital for right sided chest pain.  He underwent CT angio of the chest and was found to have an acute PE.  He was started on lovenox and coumadin.  He does not have any known risk factors for thromboembolism.  He has not had recent surgery or immobility, he is not sedentary, no history of malignancy, no family history of clots and no new medications.  Hypercoagulable panel has been sent.  We will arrange follow up visit with  hematology clinic for further work up.  He has been set up with a primary doctor and will have repeat INR on Thursday.  He will be sent home on coumadin and lovenox and receive lovenox teaching prior to discharge. He does not have shortness of breath and chest pain is improved.  He will be discharged home today.  He has been advised to abstain from alcohol.  NSAIDs have also been discontinued.  Procedures:  none  Consultations: None  Discharge Exam: Filed Vitals:   03/29/12 1617 03/29/12 2136 03/30/12 0236 03/30/12 0636  BP: 136/86 138/89 123/89 130/78  Pulse: 67 100 97 97  Temp: 98.1 F (36.7 C) 97.9 F (36.6 C) 97.6 F (36.4 C) 97.9 F (36.6 C)  TempSrc: Oral Oral Oral Oral  Resp: 17 20 20 20   Height:      Weight:    81.6 kg (179 lb 14.3 oz)  SpO2: 100% 100% 100% 97%    General: NAD Cardiovascular: s1, s2, rrr Respiratory: cta b  Discharge Instructions  Discharge Orders    Future Orders Please Complete By Expires   Diet - low sodium heart healthy      Increase activity slowly      Call MD for:  difficulty breathing, headache or visual disturbances      Call MD for:  severe uncontrolled pain      Call MD for:  temperature >100.4          Medication List     As of 03/30/2012 12:23  PM    STOP taking these medications         naproxen 500 MG tablet   Commonly known as: NAPROSYN      TAKE these medications         enoxaparin 120 MG/0.8ML injection   Commonly known as: LOVENOX   Inject 0.8 mLs (120 mg total) into the skin daily.      HYDROcodone-acetaminophen 7.5-325 MG per tablet   Commonly known as: NORCO   Take 1 tablet by mouth every 6 (six) hours as needed. Pain      PRESCRIPTION MEDICATION   Inject as directed once. Cortisone injection in left shoulder done by Dr. Hilda Lias on Dec 3.      warfarin 5 MG tablet   Commonly known as: COUMADIN   Take 1 tablet (5 mg total) by mouth daily.           Follow-up Information    Follow up with  Randall An, MD. (they will call you with appointment)    Contact information:   [address] [place] [address] [phone]       Follow up with HASANAJ,XAJE A, MD. (as scheduled. Have blood test (INR) drawn on thursday)           The results of significant diagnostics from this hospitalization (including imaging, microbiology, ancillary and laboratory) are listed below for reference.    Significant Diagnostic Studies: Dg Chest 2 View  03/27/2012  *RADIOLOGY REPORT*  Clinical Data: Right-sided chest pain.  CHEST - 2 VIEW  Comparison: None.  Findings: Lungs are hypoinflated without consolidation or effusion. Cardiomediastinal silhouette, bones and soft tissues are within normal.  IMPRESSION: No acute cardiopulmonary disease.   Original Report Authenticated By: Elberta Fortis, M.D.    Ct Angio Chest W/cm &/or Wo Cm  03/27/2012  *RADIOLOGY REPORT*  Clinical Data:  Right chest pain, elevated D-dimer, increasing pain with deep inspiration, past history smoking  CT ANGIOGRAPHY CHEST  Technique:  Multidetector CT imaging of the chest using the standard protocol during bolus administration of intravenous contrast. Multiplanar reconstructed images including MIPs were obtained and reviewed to evaluate the vascular anatomy.  Contrast: OMNIPAQUE IOHEXOL 350 MG/ML SOLN  Comparison: None  Findings: Aorta normal caliber without aneurysm or dissection. Visualized portion upper abdomen unremarkable. No thoracic adenopathy. Filling defect identified within a right middle lobe pulmonary artery compatible pulmonary embolism. No other definite pulmonary emboli visualized. Scattered atelectasis right lower lobe. Peripheral infiltrate in the right middle lobe within the segment served by the pulmonary embolus question infarct. Remaining lungs clear. No pleural effusion or pneumothorax. No acute osseous findings.  IMPRESSION: Pulmonary embolus identified within a right middle lobe pulmonary artery.  Associated infiltrate in the same segment of the right middle lobe question infarct.  Critical Value/emergent results were called by telephone at the time of interpretation on 03/27/2012 at 2253 hours to Dr. Rubin Payor, who verbally acknowledged these results.   Original Report Authenticated By: Ulyses Southward, M.D.     Microbiology: No results found for this or any previous visit (from the past 240 hour(s)).   Labs: Basic Metabolic Panel:  Lab 03/29/12 9147 03/28/12 0436 03/27/12 2305  NA 140 136 136  K 3.8 3.8 3.8  CL 105 104 100  CO2 27 25 26   GLUCOSE 93 96 102*  BUN 13 13 15   CREATININE 0.87 0.79 0.86  CALCIUM 9.1 8.3* 8.9  MG -- -- --  PHOS -- -- --   Liver Function Tests:  Lab 03/27/12 2042  AST 20  ALT 25  ALKPHOS 101  BILITOT 0.5  PROT 7.2  ALBUMIN 4.2   No results found for this basename: LIPASE:5,AMYLASE:5 in the last 168 hours No results found for this basename: AMMONIA:5 in the last 168 hours CBC:  Lab 03/29/12 0510 03/28/12 0436 03/27/12 2305  WBC 9.4 11.9* 14.6*  NEUTROABS -- -- 11.1*  HGB 14.5 13.7 15.4  HCT 42.3 39.0 43.5  MCV 97.7 96.1 96.2  PLT 202 200 211   Cardiac Enzymes: No results found for this basename: CKTOTAL:5,CKMB:5,CKMBINDEX:5,TROPONINI:5 in the last 168 hours BNP: BNP (last 3 results) No results found for this basename: PROBNP:3 in the last 8760 hours CBG: No results found for this basename: GLUCAP:5 in the last 168 hours     Signed:  Janyth Riera  [facility] 03/30/2012, 12:23 PM

## 2012-03-30 NOTE — Progress Notes (Signed)
ANTICOAGULATION CONSULT NOTE   Pharmacy Consult for Lovenox -->Coumadin Indication: pulmonary embolus  No Known Allergies  Patient Measurements: Height: 5\' 10"  (177.8 cm) Weight: 179 lb 14.3 oz (81.6 kg) IBW/kg (Calculated) : 73   Vital Signs: Temp: 97.9 F (36.6 C) (12/17 0636) Temp src: Oral (12/17 0636) BP: 130/78 mmHg (12/17 0636) Pulse Rate: 97  (12/17 0636)  Labs:  Basename 03/30/12 0456 03/29/12 0510 03/28/12 0436 03/27/12 2305  HGB -- 14.5 13.7 --  HCT -- 42.3 39.0 43.5  PLT -- 202 200 211  APTT -- -- -- 28  LABPROT 18.0* 13.6 -- 11.9  INR 1.54* 1.05 -- 0.88  HEPARINUNFRC -- -- -- --  CREATININE -- 0.87 0.79 0.86  CKTOTAL -- -- -- --  CKMB -- -- -- --  TROPONINI -- -- -- --    Estimated Creatinine Clearance: 116.5 ml/min (by C-G formula based on Cr of 0.87).  Medical History: History reviewed. No pertinent past medical history.  Medications:  Scheduled:     . enoxaparin (LOVENOX) injection  120 mg Subcutaneous Q24H  . levofloxacin  500 mg Oral Daily  . multivitamins with iron  1 tablet Oral Daily  . pantoprazole  40 mg Oral Daily  . sodium chloride  3 mL Intravenous Q12H  . thiamine  100 mg Oral Daily  . [COMPLETED] warfarin  10 mg Oral ONCE-1800  . Warfarin - Pharmacist Dosing Inpatient   Does not apply q1800   Assessment: 41 yo M with new PE on overlap day#3 of Lovenox & Coumadin.  Coumadin score is 8. INR rising to goal. No bleeding noted.   Goal of Therapy:  Anti-Xa level 0.6-1.2 units/ml 4hrs after LMWH dose given INR 2-3 Monitor platelets by anticoagulation protocol: Yes   Plan:  1) Lovenox 1.5mg /kg q24h (120mg ) for a minimum of 5 days or until INR >2 x 24hrs. 2) Coumadin 5mg  po x 1 today 3) Daily INR 4) CBC on MWF 5) Warfarin education.   Margo Aye, Ronalda Walpole A 03/30/2012,8:49 AM

## 2012-04-14 DIAGNOSIS — I2699 Other pulmonary embolism without acute cor pulmonale: Secondary | ICD-10-CM

## 2012-04-14 HISTORY — DX: Other pulmonary embolism without acute cor pulmonale: I26.99

## 2012-04-19 ENCOUNTER — Encounter (HOSPITAL_COMMUNITY): Payer: Self-pay | Admitting: Oncology

## 2012-04-19 ENCOUNTER — Encounter (HOSPITAL_COMMUNITY): Payer: Managed Care, Other (non HMO) | Attending: Oncology | Admitting: Oncology

## 2012-04-19 VITALS — BP 132/79 | HR 62 | Temp 99.0°F | Resp 18 | Ht 69.0 in | Wt 179.5 lb

## 2012-04-19 DIAGNOSIS — I2699 Other pulmonary embolism without acute cor pulmonale: Secondary | ICD-10-CM | POA: Insufficient documentation

## 2012-04-19 DIAGNOSIS — Z809 Family history of malignant neoplasm, unspecified: Secondary | ICD-10-CM

## 2012-04-19 DIAGNOSIS — Z7901 Long term (current) use of anticoagulants: Secondary | ICD-10-CM | POA: Insufficient documentation

## 2012-04-19 DIAGNOSIS — Z801 Family history of malignant neoplasm of trachea, bronchus and lung: Secondary | ICD-10-CM

## 2012-04-19 NOTE — Progress Notes (Signed)
Problem number 1 right sided pulmonary embolus, unprovoked Problem #2  20 year history of smoking 1-1/2 to 2 packs of cigarettes a day. He started smoking between the ages of 67 and 66. Problem #3 left lower leg bite from a dog several months ago Problem #4 poor dental hygiene Problem #5 history of alcohol use consisting of half gallon of vodka per week no longer consuming that much at this time Pleasant 42 year old gentleman with a sudden onset of right-sided chest pain, pleuritic in nature, associated with minimal shortness of breath. He had no precipitating factors such as a long car ride, bus ridet, surgery, trauma etc. His legs were not swollen or tender. He has no family history of a clot. He has no change in his bowel habits. No blood in his stool. No blood in his urine. And his CAT scan of his chest revealed no tumor nodularity. He is not aware of changes in his bowel habits being habits etc. Father died at age 4 of metastatic lung cancer. His paternal grandfather died of cancer in his early 35s as well but the type of cancer is not clear. His maternal grandmother died of cancer again unknown type. He had 2 children by his first marriage and the one was accidentally shot and killed. His 68 year-old younger son survives.  his hematology and oncology review of systems is noncontributory BP 132/79  Pulse 62  Temp 99 F (37.2 C) (Oral)  Resp 18  Ht 5\' 9"  (1.753 m)  Wt 179 lb 8 oz (81.421 kg)  BMI 26.51 kg/m2  He is in no acute distress. He is accompanied by his significant other of several years. He has no lymphadenopathy in any location. He is in no acute distress. Lungs are clear. Heart shows a regular rhythm and rate without murmur rub or gallop. He has no gynecomastia. Abdomen shows no organomegaly or masses. Bowel sounds are normal. Testicular exam is absolutely negative. He has no leg edema at this time. He has no tenderness to his calves. He is right handed. His poor dental hygiene  however. Tongue is normal and the midline. Eyes are equally round and reactive to light.  Think he does need a CAT scan of his abdomen and pelvis to rule out an occult malignancy.  His prothrombin 20210A assay is not back at and needs to be confirmed that it was done.  I think he needs 6-12 months of anticoagulation with Coumadin at therapeutic levels since this was a pulmonary embolus that was unclear as to etiology. He is agreeable to this plan. I Do think we should get baseline Doppler studies of his legs.

## 2012-04-19 NOTE — Patient Instructions (Addendum)
[facility] Discharge Instructions  RECOMMENDATIONS MADE BY THE CONSULTANT AND ANY TEST RESULTS WILL BE SENT TO YOUR REFERRING PHYSICIAN.  EXAM FINDINGS BY THE PHYSICIAN TODAY AND SIGNS OR SYMPTOMS TO REPORT TO CLINIC OR PRIMARY PHYSICIAN: Exam and discussion by MD.  Would prefer to stay with coumadin.  With Xarelto there is no quick way to reverse its effects should you have a major bleed.  We will monitor your PT/INR here.  MD would like for you to stop smoking.  MEDICATIONS PRESCRIBED: none  INSTRUCTIONS GIVEN AND DISCUSSED: Report unusual bruising or bleeding.  If you should have sudden onset of chest pain, shortness of breath or swelling of your extremities go to the Emergency Department.  SPECIAL INSTRUCTIONS/FOLLOW-UP: Tomorrow for blood work, CTs and ultrasound of legs on 04/26/12 and to see PA in 2 months.  Thank you for choosing [facility] to provide your oncology and hematology care.  To afford each patient quality time with our providers, please arrive at least 15 minutes before your scheduled appointment time.  With your help, our goal is to use those 15 minutes to complete the necessary work-up to ensure our physicians have the information they need to help with your evaluation and healthcare recommendations.    Effective January 1st, 2014, we ask that you re-schedule your appointment with our physicians should you arrive 10 or more minutes late for your appointment.  We strive to give you quality time with our providers, and arriving late affects you and other patients whose appointments are after yours.    Again, thank you for choosing [facility].  Our hope is that these requests will decrease the amount of time that you wait before being seen by our physicians.       _____________________________________________________________  I acknowledge that I have been informed and understand all the instructions given to me and received  a copy. I do not have any more questions at this time but understand that I may call the Cancer Center at [facility], [place] at [phone] during business hours should I have any further questions or need assistance in obtaining follow-up care.

## 2012-04-20 ENCOUNTER — Encounter (HOSPITAL_BASED_OUTPATIENT_CLINIC_OR_DEPARTMENT_OTHER): Payer: Managed Care, Other (non HMO)

## 2012-04-20 DIAGNOSIS — I2699 Other pulmonary embolism without acute cor pulmonale: Secondary | ICD-10-CM

## 2012-04-20 LAB — PROTIME-INR: Prothrombin Time: 28.4 seconds — ABNORMAL HIGH (ref 11.6–15.2)

## 2012-04-20 NOTE — Progress Notes (Signed)
Labs drawn today for pt,Prothrombin 20210 A assay

## 2012-04-23 ENCOUNTER — Other Ambulatory Visit (HOSPITAL_COMMUNITY): Payer: Self-pay | Admitting: Oncology

## 2012-04-23 DIAGNOSIS — I2699 Other pulmonary embolism without acute cor pulmonale: Secondary | ICD-10-CM

## 2012-04-26 ENCOUNTER — Ambulatory Visit (HOSPITAL_COMMUNITY): Payer: Managed Care, Other (non HMO)

## 2012-04-26 ENCOUNTER — Ambulatory Visit
Admission: RE | Admit: 2012-04-26 | Discharge: 2012-04-26 | Disposition: A | Discharge status: Home / Self Care | Payer: Managed Care, Other (non HMO) | Source: Ambulatory Visit | Attending: Oncology | Admitting: Oncology

## 2012-04-26 DIAGNOSIS — I2699 Other pulmonary embolism without acute cor pulmonale: Secondary | ICD-10-CM

## 2012-04-26 MED ORDER — IOHEXOL 300 MG/ML  SOLN
100.0000 mL | Freq: Once | INTRAMUSCULAR | Status: AC | PRN
  Administered 2012-04-26: 100 mL via INTRAVENOUS

## 2012-04-30 ENCOUNTER — Encounter (HOSPITAL_BASED_OUTPATIENT_CLINIC_OR_DEPARTMENT_OTHER): Payer: Managed Care, Other (non HMO)

## 2012-04-30 ENCOUNTER — Telehealth (HOSPITAL_COMMUNITY): Payer: Self-pay

## 2012-04-30 DIAGNOSIS — I2699 Other pulmonary embolism without acute cor pulmonale: Secondary | ICD-10-CM

## 2012-04-30 LAB — PROTIME-INR
INR: 1.38 (ref 0.00–1.49)
Prothrombin Time: 16.6 seconds — ABNORMAL HIGH (ref 11.6–15.2)

## 2012-04-30 NOTE — Telephone Encounter (Signed)
Message copied by Evelena Leyden on Fri Apr 30, 2012  6:36 PM ------      Message from: Mariel Sleet, ERIC S      Created: Fri Apr 30, 2012  6:12 PM       If he calls ,then change him to 5/7.5 inr in 7 days

## 2012-04-30 NOTE — Progress Notes (Signed)
Labs drawn today for pt 

## 2012-04-30 NOTE — Telephone Encounter (Signed)
Unable to reach by phone and message left for patient to call clinic.

## 2012-05-07 ENCOUNTER — Encounter (HOSPITAL_COMMUNITY): Payer: Managed Care, Other (non HMO)

## 2012-05-07 DIAGNOSIS — I2699 Other pulmonary embolism without acute cor pulmonale: Secondary | ICD-10-CM

## 2012-05-07 LAB — PROTIME-INR: Prothrombin Time: 25.7 seconds — ABNORMAL HIGH (ref 11.6–15.2)

## 2012-05-07 NOTE — Progress Notes (Signed)
Labs drawn today for pt 

## 2012-05-17 ENCOUNTER — Encounter (HOSPITAL_COMMUNITY): Payer: Managed Care, Other (non HMO) | Attending: Oncology

## 2012-05-17 DIAGNOSIS — I2699 Other pulmonary embolism without acute cor pulmonale: Secondary | ICD-10-CM

## 2012-05-17 LAB — PROTIME-INR: Prothrombin Time: 32.5 seconds — ABNORMAL HIGH (ref 11.6–15.2)

## 2012-05-17 NOTE — Progress Notes (Signed)
Labs drawn today for pt 

## 2012-05-26 ENCOUNTER — Other Ambulatory Visit (HOSPITAL_COMMUNITY): Payer: Self-pay | Admitting: Oncology

## 2012-05-26 DIAGNOSIS — I2699 Other pulmonary embolism without acute cor pulmonale: Secondary | ICD-10-CM

## 2012-05-26 MED ORDER — WARFARIN SODIUM 5 MG PO TABS: 5.0000 mg | ORAL_TABLET | Freq: Every day | ORAL | Status: DC

## 2012-05-27 ENCOUNTER — Other Ambulatory Visit (HOSPITAL_COMMUNITY): Payer: Managed Care, Other (non HMO)

## 2012-05-31 ENCOUNTER — Encounter (HOSPITAL_BASED_OUTPATIENT_CLINIC_OR_DEPARTMENT_OTHER): Payer: Managed Care, Other (non HMO)

## 2012-05-31 ENCOUNTER — Other Ambulatory Visit (HOSPITAL_COMMUNITY): Payer: Self-pay | Admitting: Oncology

## 2012-05-31 DIAGNOSIS — I2699 Other pulmonary embolism without acute cor pulmonale: Secondary | ICD-10-CM

## 2012-05-31 MED ORDER — WARFARIN SODIUM 1 MG PO TABS: ORAL_TABLET | ORAL | Status: DC

## 2012-05-31 NOTE — Addendum Note (Signed)
Addended by: Edythe Lynn A on: 05/31/2012 11:03 AM   Modules accepted: Orders

## 2012-05-31 NOTE — Progress Notes (Signed)
Labs drawn today for pt 

## 2012-06-10 ENCOUNTER — Other Ambulatory Visit (HOSPITAL_COMMUNITY): Payer: Self-pay | Admitting: Oncology

## 2012-06-10 ENCOUNTER — Encounter (HOSPITAL_BASED_OUTPATIENT_CLINIC_OR_DEPARTMENT_OTHER): Payer: Managed Care, Other (non HMO)

## 2012-06-10 DIAGNOSIS — I2699 Other pulmonary embolism without acute cor pulmonale: Secondary | ICD-10-CM

## 2012-06-10 LAB — PROTIME-INR
INR: 2.16 — ABNORMAL HIGH (ref 0.00–1.49)
Prothrombin Time: 23.2 seconds — ABNORMAL HIGH (ref 11.6–15.2)

## 2012-06-10 NOTE — Progress Notes (Signed)
Labs drawn today for pt 

## 2012-06-14 ENCOUNTER — Ambulatory Visit (HOSPITAL_COMMUNITY): Payer: Managed Care, Other (non HMO) | Admitting: Oncology

## 2012-06-15 NOTE — Progress Notes (Signed)
Thomas A, MD No address on file  Pulmonary embolism - Plan: Protime-INR  CURRENT THERAPY:On Coumadin anticoagulation beginning on 03/28/2012.  He will require 12 months of anticoagulation finishing December 2014  INTERVAL HISTORY: Thomas Mckenzie 42 y.o. male returns for  regular  visit for followup of Right sided pulmonary embolus, unprovoked.  Started Coumadin anticoagulation beginning on 03/28/2012.  He will need anticoagulation for 12 months. Negative doppler US of LE, negative Prothrombin II gene, and negative CT scan for malignancy.  Thomas Mckenzie is here for followup today. He reports he is tolerating his Coumadin well. He admits that he is compliant with that.  Unfortunately, he has not been perfectly managed since January of 2014 with periods of some therapeutic INR and supratherapeutic INR.  Thomas Mckenzie continues to drink alcohol. He drinks approximately 2-3 shots of liquor daily. His liquor of choice is vodka but he also drinks some scotch or bourbon occasionally.  I suspect his difficulty with INR is is secondary to his alcohol abuse. Alcohol cessation education was provided to him today.  Thomas Mckenzie also continues to smoke cigarettes. He smokes approximately 1.5 packs per day. He was encouraged to refrain from smoking and smoking cessation education was provided today.  Thomas Mckenzie admits to occasional epistaxis which is minimal/minor.  I provided him education regarding epistaxis likely being associated to the combination of Coumadin and the dry weather.  Unfortunately, Thomas Mckenzie was not too pleased with the information provided today regarding how long he will require anticoagulation. He will require Coumadin therapy for 12 months finish in December of 2014. He is agreeable to this plan but he is hoping he would only need 6 months Worth of anticoagulation.  Hematologically, he denies any complaints and ROS questioning is negative.  Past Medical History  Diagnosis Date  . Pulmonary embolism     has  Pulmonary embolism; Arthritis of left shoulder region; Tobacco abuse; and Alcohol abuse on his problem list.     has No Known Allergies.  Thomas Mckenzie had no medications administered during this visit.  History reviewed. No pertinent past surgical history.  Denies any headaches, dizziness, double vision, fevers, chills, night sweats, nausea, vomiting, diarrhea, constipation, chest pain, heart palpitations, shortness of breath, blood in stool, black tarry stool, urinary pain, urinary burning, urinary frequency, hematuria.   PHYSICAL EXAMINATION  ECOG PERFORMANCE STATUS: 0 - Asymptomatic  Filed Vitals:   06/16/12 1127  BP: 133/80  Pulse: 68  Temp: 98.2 F (36.8 C)  Resp: 18    GENERAL:alert, no distress, well nourished, well developed, comfortable, cooperative, smiling and appears chronically-ill and older than stated age. SKIN: skin color, texture, turgor are normal, no rashes or significant lesions HEAD: Normocephalic, No masses, lesions, tenderness or abnormalities EYES: normal, Conjunctiva are pink and non-injected EARS: External ears normal OROPHARYNX:mucous membranes are moist  NECK: supple, no adenopathy, thyroid normal size, non-tender, without nodularity, no stridor, non-tender, trachea midline LYMPH:  no palpable lymphadenopathy, no hepatosplenomegaly BREAST:not examined LUNGS: clear to auscultation and percussion, decreased breath sounds HEART: regular rate & rhythm, no murmurs, no gallops, S1 normal and S2 normal ABDOMEN:abdomen soft, non-tender, normal bowel sounds, no masses or organomegaly and no hepatosplenomegaly BACK: Back symmetric, no curvature., No CVA tenderness EXTREMITIES:less then 2 second capillary refill, no joint deformities, effusion, or inflammation, no edema, no skin discoloration, no clubbing, no cyanosis  NEURO: alert & oriented x 3 with fluent speech, no focal motor/sensory deficits, gait normal    LABORATORY DATA: Lab Results  Component Value  Date  INR 2.16* 06/10/2012   INR 1.50* 05/31/2012   INR 3.41* 05/17/2012     PENDING LABS: PT/INR     ASSESSMENT:  1. Right sided pulmonary embolus, unprovoked.  Started Coumadin anticoagulation beginning on 03/28/2012.  He will need anticoagulation for 12 months. Negative doppler US of LE, negative Prothrombin II gene, and negative CT scan for malignancy. 2.  20 year history of smoking 1-1/2 to 2 packs of cigarettes Mckenzie day. He started smoking between the ages of 9 and 68.  3. Left lower leg bite from Mckenzie dog several months ago  4. Poor dental hygiene  5. History of alcohol use consisting of half gallon of vodka per week no longer consuming that much at this time   PLAN:  1. I personally reviewed and went over laboratory results with the patient. 2. I personally reviewed and went over radiographic studies with the patient. 3. INR as directed 4. Smoking cessation education provided today. 5. EtOH cessation education provided today.  6. Return in 3 months for follow-up.    All questions were answered. The patient knows to call the clinic with any problems, questions or concerns. We can certainly see the patient much sooner if necessary.  Patient and plan will be discussed with Dr. Mariel Sleet within the next 24 hours.    KEFALAS,THOMAS

## 2012-06-16 ENCOUNTER — Encounter (HOSPITAL_COMMUNITY): Payer: Managed Care, Other (non HMO)

## 2012-06-16 ENCOUNTER — Encounter (HOSPITAL_COMMUNITY): Payer: Managed Care, Other (non HMO) | Attending: Oncology | Admitting: Oncology

## 2012-06-16 ENCOUNTER — Encounter (HOSPITAL_COMMUNITY): Payer: Self-pay | Admitting: Oncology

## 2012-06-16 VITALS — BP 133/80 | HR 68 | Temp 98.2°F | Resp 18 | Wt 190.0 lb

## 2012-06-16 DIAGNOSIS — D6859 Other primary thrombophilia: Secondary | ICD-10-CM

## 2012-06-16 DIAGNOSIS — Z86711 Personal history of pulmonary embolism: Secondary | ICD-10-CM

## 2012-06-16 DIAGNOSIS — F17200 Nicotine dependence, unspecified, uncomplicated: Secondary | ICD-10-CM

## 2012-06-16 DIAGNOSIS — I2699 Other pulmonary embolism without acute cor pulmonale: Secondary | ICD-10-CM

## 2012-06-16 LAB — PROTIME-INR: INR: 2.28 — ABNORMAL HIGH (ref 0.00–1.49)

## 2012-06-16 NOTE — Progress Notes (Signed)
Thomas Mckenzie presented for labwork. Labs per MD order drawn via Peripheral Line 23 gauge needle inserted in right AC  Good blood return present. Procedure without incident.  Needle removed intact. Patient tolerated procedure well.

## 2012-06-16 NOTE — Patient Instructions (Addendum)
[facility] Discharge Instructions  RECOMMENDATIONS MADE BY THE CONSULTANT AND ANY TEST RESULTS WILL BE SENT TO YOUR REFERRING PHYSICIAN.  EXAM FINDINGS BY THE PHYSICIAN TODAY AND SIGNS OR SYMPTOMS TO REPORT TO CLINIC OR PRIMARY PHYSICIAN: exam and discussion by PA.  You are doing ok.  Will continue monitoring your PT/INR levels and will adjust coumadin as needed.  MEDICATIONS PRESCRIBED:  none  INSTRUCTIONS GIVEN AND DISCUSSED: Report unusual shortness of breath, etc.  SPECIAL INSTRUCTIONS/FOLLOW-UP: Return as directed for PT/INR levels and to be seen in follow-up in 3 months.  Thank you for choosing [facility] to provide your oncology and hematology care.  To afford each patient quality time with our providers, please arrive at least 15 minutes before your scheduled appointment time.  With your help, our goal is to use those 15 minutes to complete the necessary work-up to ensure our physicians have the information they need to help with your evaluation and healthcare recommendations.    Effective January 1st, 2014, we ask that you re-schedule your appointment with our physicians should you arrive 10 or more minutes late for your appointment.  We strive to give you quality time with our providers, and arriving late affects you and other patients whose appointments are after yours.    Again, thank you for choosing [facility].  Our hope is that these requests will decrease the amount of time that you wait before being seen by our physicians.       _____________________________________________________________  Should you have questions after your visit to [facility], please contact our office at [phone] between the hours of 8:30 a.m. and 5:00 p.m.  Voicemails left after 4:30 p.m. will not be returned until the following business day.  For prescription refill requests, have your pharmacy contact our office with your  prescription refill request.

## 2012-06-25 ENCOUNTER — Encounter (HOSPITAL_COMMUNITY): Payer: Managed Care, Other (non HMO)

## 2012-06-25 DIAGNOSIS — I2699 Other pulmonary embolism without acute cor pulmonale: Secondary | ICD-10-CM

## 2012-06-25 LAB — PROTIME-INR
INR: 2.33 — ABNORMAL HIGH (ref 0.00–1.49)
Prothrombin Time: 24.5 seconds — ABNORMAL HIGH (ref 11.6–15.2)

## 2012-06-25 NOTE — Progress Notes (Signed)
Labs drawn today for pt 

## 2012-07-16 ENCOUNTER — Encounter (HOSPITAL_COMMUNITY): Payer: Managed Care, Other (non HMO) | Attending: Oncology

## 2012-07-16 DIAGNOSIS — I2699 Other pulmonary embolism without acute cor pulmonale: Secondary | ICD-10-CM

## 2012-07-16 NOTE — Progress Notes (Signed)
Labs drawn today for pt 

## 2012-07-23 ENCOUNTER — Other Ambulatory Visit (HOSPITAL_COMMUNITY): Payer: Self-pay | Admitting: Oncology

## 2012-08-13 ENCOUNTER — Encounter (HOSPITAL_COMMUNITY): Payer: Managed Care, Other (non HMO) | Attending: Oncology

## 2012-08-13 DIAGNOSIS — I2699 Other pulmonary embolism without acute cor pulmonale: Secondary | ICD-10-CM | POA: Insufficient documentation

## 2012-08-13 NOTE — Progress Notes (Signed)
Labs drawn today for pt 

## 2012-08-19 ENCOUNTER — Encounter (HOSPITAL_BASED_OUTPATIENT_CLINIC_OR_DEPARTMENT_OTHER): Payer: Managed Care, Other (non HMO)

## 2012-08-19 ENCOUNTER — Other Ambulatory Visit (HOSPITAL_COMMUNITY): Payer: Self-pay | Admitting: Oncology

## 2012-08-19 DIAGNOSIS — I2699 Other pulmonary embolism without acute cor pulmonale: Secondary | ICD-10-CM

## 2012-08-19 LAB — PROTIME-INR: INR: 2.23 — ABNORMAL HIGH (ref 0.00–1.49)

## 2012-08-19 NOTE — Progress Notes (Signed)
Labs drawn today for pt 

## 2012-08-20 ENCOUNTER — Other Ambulatory Visit (HOSPITAL_COMMUNITY): Payer: Managed Care, Other (non HMO)

## 2012-08-26 ENCOUNTER — Encounter (HOSPITAL_BASED_OUTPATIENT_CLINIC_OR_DEPARTMENT_OTHER): Payer: Managed Care, Other (non HMO)

## 2012-08-26 ENCOUNTER — Other Ambulatory Visit (HOSPITAL_COMMUNITY): Payer: Self-pay | Admitting: Oncology

## 2012-08-26 DIAGNOSIS — I2699 Other pulmonary embolism without acute cor pulmonale: Secondary | ICD-10-CM

## 2012-08-26 LAB — PROTIME-INR: INR: 3.47 — ABNORMAL HIGH (ref 0.00–1.49)

## 2012-08-26 NOTE — Progress Notes (Signed)
Labs drawn today for pt 

## 2012-08-27 ENCOUNTER — Other Ambulatory Visit (HOSPITAL_COMMUNITY): Payer: Managed Care, Other (non HMO)

## 2012-09-02 ENCOUNTER — Other Ambulatory Visit (HOSPITAL_COMMUNITY): Payer: Self-pay | Admitting: Hematology and Oncology

## 2012-09-02 ENCOUNTER — Encounter (HOSPITAL_BASED_OUTPATIENT_CLINIC_OR_DEPARTMENT_OTHER): Payer: Managed Care, Other (non HMO)

## 2012-09-02 DIAGNOSIS — I2699 Other pulmonary embolism without acute cor pulmonale: Secondary | ICD-10-CM

## 2012-09-02 LAB — PROTIME-INR: Prothrombin Time: 21.5 seconds — ABNORMAL HIGH (ref 11.6–15.2)

## 2012-09-02 NOTE — Progress Notes (Signed)
Labs drawn today for pt 

## 2012-09-02 NOTE — Addendum Note (Signed)
Addended by: Dennie Maizes on: 09/02/2012 02:52 PM   Modules accepted: Orders

## 2012-09-04 DIAGNOSIS — S8991XA Unspecified injury of right lower leg, initial encounter: Secondary | ICD-10-CM

## 2012-09-04 HISTORY — DX: Unspecified injury of right lower leg, initial encounter: S89.91XA

## 2012-09-09 ENCOUNTER — Encounter (HOSPITAL_COMMUNITY): Payer: Managed Care, Other (non HMO)

## 2012-09-09 ENCOUNTER — Other Ambulatory Visit (HOSPITAL_COMMUNITY): Payer: Self-pay | Admitting: Oncology

## 2012-09-09 DIAGNOSIS — I2699 Other pulmonary embolism without acute cor pulmonale: Secondary | ICD-10-CM

## 2012-09-09 LAB — PROTIME-INR
INR: 2.25 — ABNORMAL HIGH (ref 0.00–1.49)
Prothrombin Time: 23.9 seconds — ABNORMAL HIGH (ref 11.6–15.2)

## 2012-09-09 NOTE — Progress Notes (Signed)
Labs drawn today for pt 

## 2012-09-13 ENCOUNTER — Emergency Department (HOSPITAL_COMMUNITY): Payer: Managed Care, Other (non HMO)

## 2012-09-13 ENCOUNTER — Emergency Department (HOSPITAL_COMMUNITY)
Admission: EM | Admit: 2012-09-13 | Discharge: 2012-09-13 | Disposition: A | Discharge status: Home / Self Care | Payer: Managed Care, Other (non HMO) | Attending: Emergency Medicine | Admitting: Emergency Medicine

## 2012-09-13 ENCOUNTER — Encounter (HOSPITAL_COMMUNITY): Payer: Self-pay | Admitting: *Deleted

## 2012-09-13 DIAGNOSIS — F17200 Nicotine dependence, unspecified, uncomplicated: Secondary | ICD-10-CM | POA: Insufficient documentation

## 2012-09-13 DIAGNOSIS — Y9389 Activity, other specified: Secondary | ICD-10-CM | POA: Insufficient documentation

## 2012-09-13 DIAGNOSIS — S8010XA Contusion of unspecified lower leg, initial encounter: Secondary | ICD-10-CM | POA: Insufficient documentation

## 2012-09-13 DIAGNOSIS — R000 Tachycardia, unspecified: Secondary | ICD-10-CM | POA: Insufficient documentation

## 2012-09-13 DIAGNOSIS — Z86711 Personal history of pulmonary embolism: Secondary | ICD-10-CM | POA: Insufficient documentation

## 2012-09-13 DIAGNOSIS — Z7901 Long term (current) use of anticoagulants: Secondary | ICD-10-CM | POA: Insufficient documentation

## 2012-09-13 DIAGNOSIS — S8011XA Contusion of right lower leg, initial encounter: Secondary | ICD-10-CM

## 2012-09-13 DIAGNOSIS — L089 Local infection of the skin and subcutaneous tissue, unspecified: Secondary | ICD-10-CM | POA: Insufficient documentation

## 2012-09-13 DIAGNOSIS — W208XXA Other cause of strike by thrown, projected or falling object, initial encounter: Secondary | ICD-10-CM | POA: Insufficient documentation

## 2012-09-13 DIAGNOSIS — B9689 Other specified bacterial agents as the cause of diseases classified elsewhere: Secondary | ICD-10-CM

## 2012-09-13 DIAGNOSIS — Y9289 Other specified places as the place of occurrence of the external cause: Secondary | ICD-10-CM | POA: Insufficient documentation

## 2012-09-13 MED ORDER — SULFAMETHOXAZOLE-TRIMETHOPRIM 800-160 MG PO TABS: 1.0000 | ORAL_TABLET | Freq: Two times a day (BID) | ORAL | Status: DC

## 2012-09-13 MED ORDER — HYDROCODONE-ACETAMINOPHEN 5-325 MG PO TABS: 1.0000 | ORAL_TABLET | ORAL | Status: DC | PRN

## 2012-09-13 MED ORDER — OXYCODONE-ACETAMINOPHEN 5-325 MG PO TABS
1.0000 | ORAL_TABLET | Freq: Once | ORAL | Status: AC
  Administered 2012-09-13: 1 via ORAL
  Filled 2012-09-13: qty 1

## 2012-09-13 MED ORDER — VANCOMYCIN HCL IN DEXTROSE 1-5 GM/200ML-% IV SOLN
1000.0000 mg | Freq: Once | INTRAVENOUS | Status: AC
  Administered 2012-09-13: 1000 mg via INTRAVENOUS
  Filled 2012-09-13: qty 200

## 2012-09-13 NOTE — ED Provider Notes (Signed)
History     CSN: [id number]  Arrival date & time 09/13/12  1540   First MD Initiated Contact with Patient 09/13/12 1735      Chief Complaint  Patient presents with  . Leg Pain    (Consider location/radiation/quality/duration/timing/severity/associated sxs/prior treatment) Patient is a 42 y.o. male presenting with leg pain. The history is provided by the patient.  Leg Pain Location:  Leg Time since incident:  8 days Injury: yes   Mechanism of injury comment:  Dropped log on leg Leg location:  R leg Pain details:    Quality:  Shooting and sharp   Radiates to:  Does not radiate   Severity:  Moderate   Onset quality:  Gradual   Duration:  8 days   Timing:  Constant   Progression:  Worsening Chronicity:  New Dislocation: no   Foreign body present:  No foreign bodies Prior injury to area:  No Relieved by:  Nothing Worsened by:  Activity and bearing weight Associated symptoms: no fever and no neck pain     Past Medical History  Diagnosis Date  . Pulmonary embolism     History reviewed. No pertinent past surgical history.  Family History  Problem Relation Age of Onset  . Cancer Father     liver  . Coronary artery disease Father     History  Substance Use Topics  . Smoking status: Current Every Day Smoker -- 2.00 packs/day for 20 years    Types: Cigarettes  . Smokeless tobacco: Former Neurosurgeon    Types: Chew    Quit date: 03/28/2008  . Alcohol Use: Yes      Review of Systems  Constitutional: Negative for fever and chills.  HENT: Negative for neck pain.     Allergies  Review of patient's allergies indicates no known allergies.  Home Medications   Current Outpatient Rx  Name  Route  Sig  Dispense  Refill  . warfarin (COUMADIN) 1 MG tablet      Use as directed for a coumadin regimen of 5 mg alternating with 6 mg   30 tablet   1   . warfarin (COUMADIN) 5 MG tablet   Oral   Take 5 mg by mouth daily. Alternates taking 5mg  with 6mg . (6,5,5,6,5,5)          BP 125/84  Pulse 105  Temp(Src) 98.2 F (36.8 C) (Oral)  Ht 5\' 9"  (1.753 m)  Wt 180 lb (81.647 kg)  BMI 26.57 kg/m2  SpO2 99%  Physical Exam  Nursing note and vitals reviewed. Constitutional: He is oriented to person, place, and time. He appears well-developed and well-nourished.  HENT:  Head: Normocephalic and atraumatic.  Eyes: EOM are normal.  Neck: Neck supple.  Cardiovascular: Tachycardia present.   Pulmonary/Chest: Effort normal.  Musculoskeletal:       Right lower leg: He exhibits tenderness and swelling.       Legs: The right lower leg exhibits redness with a central area approximately 3 cm that is raised tender and fluctuant.   Pedal pulses present and strong.   Neurological: He is alert and oriented to person, place, and time. No cranial nerve deficit.  Skin: Skin is warm and dry.  Psychiatric: He has a normal mood and affect.    ED Course  Procedures (including critical care time)  Labs Reviewed - No data to display Dg Tibia/fibula Right  09/13/2012   *RADIOLOGY REPORT*  Clinical Data: Trauma.  Leg pain and bruising.  RIGHT TIBIA AND FIBULA -  2 VIEW  Comparison: None.  Findings: Subcutaneous edema is present in the leg.  There is no fracture.  No radiopaque foreign body. Tibia and fibula intact.  IMPRESSION: Soft tissue swelling without osseous injury.   Original Report Authenticated By: Andreas Newport, M.D.   MDM: Patient taking Coumadin because of PE less than one year ago.  Dr. Adriana Simas in to examine the patient and will give Vancomycin 1 gram IV now.  Will d/c home with Bactrim and he will return for recheck in 2 days or sooner if symptoms worsen.  42 y.o. male with infection of the right lower leg.  Discussed with the patient clinical findings and plan of care and all questioned fully answered.    Medication List    TAKE these medications       HYDROcodone-acetaminophen 5-325 MG per tablet  Commonly known as:  NORCO/VICODIN  Take 1 tablet by mouth  every 4 (four) hours as needed.     sulfamethoxazole-trimethoprim 800-160 MG per tablet  Commonly known as:  SEPTRA DS  Take 1 tablet by mouth every 12 (twelve) hours.      ASK your doctor about these medications       warfarin 1 MG tablet  Commonly known as:  COUMADIN  Use as directed for a coumadin regimen of 5 mg alternating with 6 mg     warfarin 5 MG tablet  Commonly known as:  COUMADIN  Take 5 mg by mouth daily. Alternates taking 5mg  with 6mg . (6,5,5,6,5,5)              Janne Napoleon, NP 09/13/12 1851

## 2012-09-13 NOTE — ED Notes (Addendum)
Pain  Rt lower leg, dropped  A log on his leg , contusion abrasion to calf. Pt is on coumadin

## 2012-09-14 NOTE — ED Provider Notes (Signed)
Medical screening examination/treatment/procedure(s) were conducted as a shared visit with non-physician practitioner(s) and myself.  I personally evaluated the patient during the encounter.  Wound is infected.  Will hold I and  D secondary to coumadin rx.  Start atb.  Recheck 1-2 days   Donnetta Hutching, MD 09/14/12 [phone]

## 2012-09-15 ENCOUNTER — Encounter (HOSPITAL_COMMUNITY): Payer: Managed Care, Other (non HMO) | Attending: Oncology | Admitting: Oncology

## 2012-09-15 ENCOUNTER — Ambulatory Visit (HOSPITAL_COMMUNITY): Payer: Managed Care, Other (non HMO) | Admitting: Oncology

## 2012-09-15 ENCOUNTER — Encounter (HOSPITAL_COMMUNITY): Payer: Self-pay | Admitting: Oncology

## 2012-09-15 VITALS — BP 128/81 | HR 79 | Temp 98.8°F | Resp 16 | Wt 181.2 lb

## 2012-09-15 DIAGNOSIS — Z7901 Long term (current) use of anticoagulants: Secondary | ICD-10-CM

## 2012-09-15 DIAGNOSIS — I2699 Other pulmonary embolism without acute cor pulmonale: Secondary | ICD-10-CM

## 2012-09-15 DIAGNOSIS — F17200 Nicotine dependence, unspecified, uncomplicated: Secondary | ICD-10-CM

## 2012-09-15 DIAGNOSIS — M7989 Other specified soft tissue disorders: Secondary | ICD-10-CM

## 2012-09-15 NOTE — Patient Instructions (Addendum)
[facility] Discharge Instructions  RECOMMENDATIONS MADE BY THE CONSULTANT AND ANY TEST RESULTS WILL BE SENT TO YOUR REFERRING PHYSICIAN.  EXAM FINDINGS BY THE PHYSICIAN TODAY AND SIGNS OR SYMPTOMS TO REPORT TO CLINIC OR PRIMARY PHYSICIAN: Exam and discussion by MD.  Will continue your coumadin through December.  MEDICATIONS PRESCRIBED:  none  INSTRUCTIONS GIVEN AND DISCUSSED: Report swelling of extremity, unusual shortness of breath, sudden chest discomfort (go to ED first then contact us)  SPECIAL INSTRUCTIONS/FOLLOW-UP: PT/INR on Monday as scheduled and follow-up with PA in 3 months.  Thank you for choosing [facility] to provide your oncology and hematology care.  To afford each patient quality time with our providers, please arrive at least 15 minutes before your scheduled appointment time.  With your help, our goal is to use those 15 minutes to complete the necessary work-up to ensure our physicians have the information they need to help with your evaluation and healthcare recommendations.    Effective January 1st, 2014, we ask that you re-schedule your appointment with our physicians should you arrive 10 or more minutes late for your appointment.  We strive to give you quality time with our providers, and arriving late affects you and other patients whose appointments are after yours.    Again, thank you for choosing [facility].  Our hope is that these requests will decrease the amount of time that you wait before being seen by our physicians.       _____________________________________________________________  Should you have questions after your visit to [facility], please contact our office at [phone] between the hours of 8:30 a.m. and 5:00 p.m.  Voicemails left after 4:30 p.m. will not be returned until the following business day.  For prescription refill requests, have your pharmacy contact our office with your  prescription refill request.

## 2012-09-15 NOTE — Progress Notes (Signed)
#  1 right sided pulmonary embolus, unprovoked. He had a negative hypercoagulable workup and no evidence for cancer etc. There is no family history of blood clots etc. #2 new right sided leg trauma which is now on an antibiotic. He has a swollen medial/inferior mid upper lower leg after dropping along on this area for which she was seen in the ED get a day. I suspect is more swollen because of the Coumadin usage. #3 long-standing smoking history of one and a half to 2 packs a day. He started smoking between the ages of 9 and 83. I've asked him to quit smoking. #4 poor dental hygiene #5 history of excess alcohol use in the past  He had been doing fine until he dropped along on his right leg. Once again he is no family history of clotting, no positive hypercoagulability on his workup in no obvious cancer. Therefore before committing him to lifelong Coumadin I think he should just have 12 months of therapeutic anticoagulation followed by followup. He understands this and is agreeable to this plan.  We'll see him back in 3 months which will be September but we will stop anticoagulation at the end of December 2014. Then we will need to see him every 6 months.

## 2012-09-20 ENCOUNTER — Other Ambulatory Visit (HOSPITAL_COMMUNITY): Payer: Self-pay | Admitting: Oncology

## 2012-09-20 ENCOUNTER — Encounter (HOSPITAL_COMMUNITY): Payer: Managed Care, Other (non HMO)

## 2012-09-20 DIAGNOSIS — I2699 Other pulmonary embolism without acute cor pulmonale: Secondary | ICD-10-CM

## 2012-09-20 LAB — PROTIME-INR: Prothrombin Time: 39.5 seconds — ABNORMAL HIGH (ref 11.6–15.2)

## 2012-09-20 NOTE — Progress Notes (Signed)
Patient's girlfriend notified that [name] should hold coumadin for 2 days and return Wednesday am for pt inr.

## 2012-09-20 NOTE — Progress Notes (Signed)
Labs drawn today for pt 

## 2012-09-22 ENCOUNTER — Encounter (HOSPITAL_COMMUNITY): Payer: Managed Care, Other (non HMO)

## 2012-09-22 ENCOUNTER — Other Ambulatory Visit (HOSPITAL_COMMUNITY): Payer: Self-pay | Admitting: Oncology

## 2012-09-22 DIAGNOSIS — I2699 Other pulmonary embolism without acute cor pulmonale: Secondary | ICD-10-CM

## 2012-09-22 LAB — PROTIME-INR: INR: 2.05 — ABNORMAL HIGH (ref 0.00–1.49)

## 2012-09-22 NOTE — Progress Notes (Signed)
Labs drawn today for pt 

## 2012-09-27 ENCOUNTER — Other Ambulatory Visit (HOSPITAL_COMMUNITY): Payer: Self-pay | Admitting: Oncology

## 2012-09-27 ENCOUNTER — Encounter (HOSPITAL_BASED_OUTPATIENT_CLINIC_OR_DEPARTMENT_OTHER): Payer: Managed Care, Other (non HMO)

## 2012-09-27 DIAGNOSIS — I2699 Other pulmonary embolism without acute cor pulmonale: Secondary | ICD-10-CM

## 2012-09-27 NOTE — Progress Notes (Signed)
Labs drawn today for pt 

## 2012-10-01 ENCOUNTER — Encounter (HOSPITAL_BASED_OUTPATIENT_CLINIC_OR_DEPARTMENT_OTHER): Payer: Managed Care, Other (non HMO)

## 2012-10-01 ENCOUNTER — Other Ambulatory Visit (HOSPITAL_COMMUNITY): Payer: Self-pay | Admitting: Oncology

## 2012-10-01 DIAGNOSIS — I2699 Other pulmonary embolism without acute cor pulmonale: Secondary | ICD-10-CM

## 2012-10-01 LAB — PROTIME-INR
INR: 1.72 — ABNORMAL HIGH (ref 0.00–1.49)
Prothrombin Time: 19.6 seconds — ABNORMAL HIGH (ref 11.6–15.2)

## 2012-10-01 NOTE — Progress Notes (Signed)
Labs drawn today for pt 

## 2012-10-11 ENCOUNTER — Other Ambulatory Visit (HOSPITAL_COMMUNITY): Payer: Self-pay | Admitting: Oncology

## 2012-10-11 ENCOUNTER — Encounter (HOSPITAL_BASED_OUTPATIENT_CLINIC_OR_DEPARTMENT_OTHER): Payer: Managed Care, Other (non HMO)

## 2012-10-11 DIAGNOSIS — I2699 Other pulmonary embolism without acute cor pulmonale: Secondary | ICD-10-CM

## 2012-10-11 LAB — PROTIME-INR: INR: 3.42 — ABNORMAL HIGH (ref 0.00–1.49)

## 2012-10-11 NOTE — Progress Notes (Signed)
Labs drawn today for pt 

## 2012-10-14 ENCOUNTER — Encounter (HOSPITAL_COMMUNITY): Payer: Self-pay | Admitting: Oncology

## 2012-10-14 ENCOUNTER — Telehealth (HOSPITAL_COMMUNITY): Payer: Self-pay | Admitting: Oncology

## 2012-10-14 NOTE — Telephone Encounter (Signed)
Multiple unsuccessful attempts made to reach patient by phone regarding coumadin level and need for follow-up - letter mailed asking pt to contact our clinic (and update his contact info too).

## 2012-10-18 ENCOUNTER — Other Ambulatory Visit (HOSPITAL_COMMUNITY): Payer: Managed Care, Other (non HMO)

## 2012-10-29 ENCOUNTER — Encounter (HOSPITAL_COMMUNITY): Payer: Managed Care, Other (non HMO) | Attending: Oncology

## 2012-10-29 DIAGNOSIS — I2699 Other pulmonary embolism without acute cor pulmonale: Secondary | ICD-10-CM

## 2012-10-29 LAB — PROTIME-INR
INR: 1.12 (ref 0.00–1.49)
Prothrombin Time: 14.2 seconds (ref 11.6–15.2)

## 2012-10-29 MED ORDER — WARFARIN SODIUM 5 MG PO TABS: 5.0000 mg | ORAL_TABLET | Freq: Every day | ORAL | Status: DC

## 2012-11-05 ENCOUNTER — Other Ambulatory Visit (HOSPITAL_COMMUNITY): Payer: Managed Care, Other (non HMO)

## 2012-11-29 ENCOUNTER — Other Ambulatory Visit (HOSPITAL_COMMUNITY): Payer: Self-pay | Admitting: Oncology

## 2012-11-29 ENCOUNTER — Encounter (HOSPITAL_COMMUNITY): Payer: Managed Care, Other (non HMO) | Attending: Oncology

## 2012-11-29 DIAGNOSIS — I2699 Other pulmonary embolism without acute cor pulmonale: Secondary | ICD-10-CM | POA: Insufficient documentation

## 2012-11-29 NOTE — Progress Notes (Signed)
Labs drawn today for pt 

## 2012-11-30 ENCOUNTER — Encounter (HOSPITAL_COMMUNITY): Payer: Self-pay

## 2012-12-07 ENCOUNTER — Other Ambulatory Visit (HOSPITAL_COMMUNITY): Payer: Managed Care, Other (non HMO)

## 2012-12-15 ENCOUNTER — Ambulatory Visit (HOSPITAL_COMMUNITY): Payer: Managed Care, Other (non HMO) | Admitting: Oncology

## 2012-12-15 NOTE — Progress Notes (Signed)
-  No show, letter sent-   

## 2013-01-24 ENCOUNTER — Other Ambulatory Visit (HOSPITAL_COMMUNITY): Payer: Managed Care, Other (non HMO)

## 2013-01-24 ENCOUNTER — Ambulatory Visit (HOSPITAL_COMMUNITY): Payer: Managed Care, Other (non HMO) | Admitting: Oncology

## 2013-01-24 NOTE — Progress Notes (Signed)
-  No show, letter sent-   

## 2013-06-22 ENCOUNTER — Encounter (HOSPITAL_COMMUNITY): Payer: Self-pay | Admitting: Emergency Medicine

## 2013-06-22 ENCOUNTER — Emergency Department (HOSPITAL_COMMUNITY)
Admission: EM | Admit: 2013-06-22 | Discharge: 2013-06-22 | Disposition: A | Discharge status: Home / Self Care | Payer: Managed Care, Other (non HMO) | Attending: Emergency Medicine | Admitting: Emergency Medicine

## 2013-06-22 DIAGNOSIS — F329 Major depressive disorder, single episode, unspecified: Secondary | ICD-10-CM

## 2013-06-22 DIAGNOSIS — F17200 Nicotine dependence, unspecified, uncomplicated: Secondary | ICD-10-CM | POA: Insufficient documentation

## 2013-06-22 DIAGNOSIS — Z87828 Personal history of other (healed) physical injury and trauma: Secondary | ICD-10-CM | POA: Insufficient documentation

## 2013-06-22 DIAGNOSIS — F1010 Alcohol abuse, uncomplicated: Secondary | ICD-10-CM | POA: Insufficient documentation

## 2013-06-22 DIAGNOSIS — Z86711 Personal history of pulmonary embolism: Secondary | ICD-10-CM | POA: Insufficient documentation

## 2013-06-22 DIAGNOSIS — F3289 Other specified depressive episodes: Secondary | ICD-10-CM | POA: Insufficient documentation

## 2013-06-22 DIAGNOSIS — F32A Depression, unspecified: Secondary | ICD-10-CM

## 2013-06-22 LAB — COMPREHENSIVE METABOLIC PANEL
ALT: 19 U/L (ref 0–53)
AST: 25 U/L (ref 0–37)
Albumin: 4.2 g/dL (ref 3.5–5.2)
Alkaline Phosphatase: 147 U/L — ABNORMAL HIGH (ref 39–117)
BILIRUBIN TOTAL: 0.4 mg/dL (ref 0.3–1.2)
BUN: 10 mg/dL (ref 6–23)
CHLORIDE: 96 meq/L (ref 96–112)
CO2: 27 meq/L (ref 19–32)
Calcium: 9.2 mg/dL (ref 8.4–10.5)
Creatinine, Ser: 0.85 mg/dL (ref 0.50–1.35)
GFR calc Af Amer: >90 mL/min (ref >90)
Glucose, Bld: 87 mg/dL (ref 70–99)
Potassium: 3.8 mEq/L (ref 3.7–5.3)
SODIUM: 138 meq/L (ref 137–147)
Total Protein: 7.5 g/dL (ref 6.0–8.3)

## 2013-06-22 LAB — ACETAMINOPHEN LEVEL: Acetaminophen (Tylenol), Serum: <15 ug/mL (ref 10–30)

## 2013-06-22 LAB — CBC
HCT: 47 % (ref 39.0–52.0)
Hemoglobin: 17.2 g/dL — ABNORMAL HIGH (ref 13.0–17.0)
MCH: 33.9 pg (ref 26.0–34.0)
MCHC: 36.6 g/dL — ABNORMAL HIGH (ref 30.0–36.0)
MCV: 92.7 fL (ref 78.0–100.0)
PLATELETS: 209 10*3/uL (ref 150–400)
RBC: 5.07 MIL/uL (ref 4.22–5.81)
RDW: 13.2 % (ref 11.5–15.5)
WBC: 11 10*3/uL — AB (ref 4.0–10.5)

## 2013-06-22 LAB — RAPID URINE DRUG SCREEN, HOSP PERFORMED
AMPHETAMINES: NOT DETECTED
Barbiturates: NOT DETECTED
Benzodiazepines: NOT DETECTED
Cocaine: NOT DETECTED
OPIATES: NOT DETECTED
TETRAHYDROCANNABINOL: POSITIVE — AB

## 2013-06-22 LAB — ETHANOL: Alcohol, Ethyl (B): 183 mg/dL — ABNORMAL HIGH (ref 0–11)

## 2013-06-22 LAB — SALICYLATE LEVEL: Salicylate Lvl: <2 mg/dL — ABNORMAL LOW (ref 2.8–20.0)

## 2013-06-22 MED ORDER — NICOTINE 21 MG/24HR TD PT24: 21.0000 mg | MEDICATED_PATCH | Freq: Every day | TRANSDERMAL | Status: DC

## 2013-06-22 MED ORDER — ZOLPIDEM TARTRATE 5 MG PO TABS: 5.0000 mg | ORAL_TABLET | Freq: Every evening | ORAL | Status: DC | PRN

## 2013-06-22 MED ORDER — LORAZEPAM 1 MG PO TABS: 1.0000 mg | ORAL_TABLET | Freq: Three times a day (TID) | ORAL | Status: DC | PRN

## 2013-06-22 MED ORDER — ONDANSETRON HCL 4 MG PO TABS
4.0000 mg | ORAL_TABLET | Freq: Three times a day (TID) | ORAL | Status: DC | PRN
Start: 2013-06-22 — End: 2013-06-23

## 2013-06-22 MED ORDER — ALUM & MAG HYDROXIDE-SIMETH 200-200-20 MG/5ML PO SUSP: 30.0000 mL | ORAL | Status: DC | PRN

## 2013-06-22 MED ORDER — IBUPROFEN 400 MG PO TABS: 600.0000 mg | ORAL_TABLET | Freq: Three times a day (TID) | ORAL | Status: DC | PRN

## 2013-06-22 MED ORDER — ACETAMINOPHEN 325 MG PO TABS: 650.0000 mg | ORAL_TABLET | ORAL | Status: DC | PRN

## 2013-06-22 NOTE — BH Assessment (Signed)
Call made to Dr Jodi MourningZavitz who reports pt presenting with depression and alcohol, THC use. Coworker had called unti requesting Cart 2 be put in patients.  Carney Bernatherine C Joannie Medine, LCSW

## 2013-06-22 NOTE — ED Notes (Signed)
Pt reports he is feeling depressed, sts it has been going on for a while but doesn't remember when it started. sts today he was standing at his kitchen sink when he just started crying for no reason. Denies SI/HI, sts "I love me some me". Denies seeing someone for his depression. Reports he came in because he doesn't think he can do anything to fix it himself. Nad, skin warm and dry, resp e/u.

## 2013-06-22 NOTE — Discharge Instructions (Signed)
°Emergency Department Resource Guide °1) Find a Doctor and Pay Out of Pocket °Although you won't have to find out who is covered by your insurance plan, it is a good idea to ask around and get recommendations. You will then need to call the office and see if the doctor you have chosen will accept you as a new patient and what types of options they offer for patients who are self-pay. Some doctors offer discounts or will set up payment plans for their patients who do not have insurance, but you will need to ask so you aren't surprised when you get to your appointment. ° °2) Contact Your Local Health Department °Not all health departments have doctors that can see patients for sick visits, but many do, so it is worth a call to see if yours does. If you don't know where your local health department is, you can check in your phone book. The CDC also has a tool to help you locate your state's health department, and many state websites also have listings of all of their local health departments. ° °3) Find a Walk-in Clinic °If your illness is not likely to be very severe or complicated, you may want to try a walk in clinic. These are popping up all over the country in pharmacies, drugstores, and shopping centers. They're usually staffed by nurse practitioners or physician assistants that have been trained to treat common illnesses and complaints. They're usually fairly quick and inexpensive. However, if you have serious medical issues or chronic medical problems, these are probably not your best option. ° °No Primary Care Doctor: °- Call Health Connect at  832-8000 - they can help you locate a primary care doctor that  accepts your insurance, provides certain services, etc. °- Physician Referral Service- 1-800-533-3463 ° °Chronic Pain Problems: °Organization         Address  Phone   Notes  °[facility]  (336) 297-2271 Patients need to be referred by their primary care doctor.  ° °Medication  Assistance: °Organization         Address  Phone   Notes  °[place] [facility] [address] °[place], [place] [address] (336) 641-8030 --Must be a resident of [place] °-- Must have NO insurance coverage whatsoever (no Medicaid/ Medicare, etc.) °-- The pt. MUST have a primary care doctor that directs their care regularly and follows them in the community °  °[facility]  (866) 331-1348   °[employer]  (888) 892-1162   ° °Agencies that provide inexpensive medical care: °Organization         Address  Phone   Notes  °[facility]  (336) 832-8035   °[facility]    (336) 832-7272   °[facility] [address] °[place], [place] [address] (336) 832-4777   °[facility] [address], °[place] (336) 271-4999   °[facility]    (336) 373-0678   °[facility]    (336) 272-1050   °[facility] ° [address], [place] Phone:  (336) 832-4444, Fax:  (336) 832-4440 Hours of Operation:  9 am - 6 pm, M-F.  Also accepts Medicaid/Medicare and self-pay.  °[facility] ° [address], [place] Phone: (336) 832-3150, Fax: (336) [phone]. Hours of Operation:  8:30 am - 5:30 pm, M-F.  Also accepts Medicaid and self-pay.  °[facility] [place] [address], [place] Phone: (336) 878-6027   °[facility] [address], [place], [place] (336)723-1848, Ext. 123 Mondays & Thursdays: 7-9 AM.  First 15 patients are seen on a first come, first serve basis. °  ° °Medicaid-accepting [place] Providers: ° °Organization         Address  Phone   Notes  °[facility] [address], [place] (336) 641-2100 Also accepts self-pay patients.  °[facility] [address], [place] ° (336) 856-9996   °[facility] [address], [place]  (336) 288-8857   °[facility] [address] [place] [address], [place] (336) 299-7000   °Veita Bland [address], [place]  ° (336) 373-1557 Only accepts [place] Access Medicaid patients after they have their name applied to their card.  ° °Self-Pay (no insurance) in [place]: ° °Organization         Address  Phone   Notes  °Sickle Cell Patients, [facility] [address], [place] (336) 832-1970   °[facility] [address], [place] (336) 832-4400   °[facility] ° [address], [place] (336) 992-4800   °[facility]/Dr. Osei-Bonsu ° [address] [place] [address], [place] or [address], [place] (336) 841-8500 Phone number for both [place] and [place] locations is the same.  °[facility] [address], [place] (336) 299-0000   °[facility] [address] [place] [address], [place] or [address] (336) 852-7530 °(336) 878-2260   °[facility] [address], [place] (336) 350-1642, phone; (336) 294-5005, fax Sees patients 1st and 3rd Saturday of every month.  Must not qualify for public or private insurance (i.e. Medicaid, Medicare, [place] Health Choice, Veterans' Benefits) • Household income should be no more than 200% of the poverty level •The clinic cannot treat you if you are pregnant or think you are pregnant • Sexually transmitted diseases are not treated at the clinic.  ° ° °Dental Care: °Organization         Address  Phone  Notes  °[place] [facility] [address], [place] (336) 641-6152 Accepts children up to age 21 who are enrolled in Medicaid or [place] Health Choice; pregnant women with a Medicaid card; and children who have applied for Medicaid or [place] Health Choice, but were declined, whose parents can pay a reduced fee at time of service.  °[place]  [facility] [place]  [address], [place] (336) 641-7733 Accepts children up to age 21 who are enrolled in Medicaid or [place] Health Choice; pregnant women with a Medicaid card; and children who have applied for Medicaid or [place] Health Choice, but were declined, whose parents can pay a reduced fee at time of service.  °[facility] ° [address], [place] (336) 641-4533 Patients are seen by appointment only. Walk-ins are not accepted. [facility] will see patients 18 years of age and older. °Monday - Tuesday (8am-5pm) °Most Wednesdays (8:30-5pm) °$30 per visit, cash only  °[facility] ° [address], [place] (336) 641-4533 Patients are seen by appointment only. Walk-ins are not accepted. [facility] will see patients 18 years of age and older. °One   Wednesday Evening (Monthly: Volunteer Based).  $30 per visit, cash only  °[facility]  (919) 537-3737 for adults; Children under age 4, call Graduate Pediatric Dentistry at ([phone]) [phone]. Children aged 4-14, please call (919) [phone] to request a pediatric application. ° Dental services are provided in all areas of dental care including fillings, crowns and bridges, complete and partial dentures, implants, gum treatment, root canals, and extractions. Preventive care is also provided. Treatment is provided to both adults and children. °Patients are selected via a lottery and there is often a waiting list. °  °[facility] [address], °[place] ° (336) 763-8833 [email or web address] °  °[facility] [address], [place], [place] (336)723-1848, Ext. 123 Second and Fourth Thursday of each month, opens at 6:30 AM; Clinic ends at 9 AM.  Patients are seen on a first-come first-served basis, and a limited number are seen during each clinic.  ° °[facility] ° [address], [place], [place] (336) 723-7904    Eligibility Requirements °You must have lived in [place], [place], or [place] counties for at least the last three months. °  You cannot be eligible for state or federal sponsored healthcare insurance, including Veterans Administration, Medicaid, or Medicare. °  You generally cannot be eligible for healthcare insurance through your employer.  °  How to apply: °Eligibility screenings are held every Tuesday and Wednesday afternoon from 1:00 pm until 4:00 pm. You do not need an appointment for the interview!  °[facility] [address], [place], [place] 336-631-2330   °[facility]  336-342-8273   °[facility]  336-703-3100   °[facility]  336-570-6415   ° °Behavioral Health Resources in the Community: °Intensive Outpatient Programs °Organization         Address  Phone  Notes  °[facility] [address], [place], [place] 336-878-6098   °[facility] [address], [place], [place] 336-832-9800   °[facility] [address], [place], [place] ° 336-882-2125   °[facility] [address],  °[place], [place] 1-800-853-5163 or 336-641-4981   °Substance Abuse Resources °Organization         Address  Phone  Notes  °[facility]  336-882-2125   °[facility]  336-784-9470   °[facility]  336-285-9073   °[facility]  336-845-3988   °Residential & Outpatient Substance Abuse Program  1-800-659-3381   °Psychological Services °Organization         Address  Phone  Notes  °[facility]  336- 832-9600   °[facility]  336- 378-7881   °[facility] [address], [place] 1-800-853-5163 or 336-641-4981   ° °Mobile Crisis Teams °Organization         Address  Phone  Notes  °[facility], Mobile Crisis Care Unit  1-877-626-1772   °Assertive °[facility] ° [address]  [place], [place] 336-834-9664   °Sharon DeEsch [address] °[place] 336-554-5454   ° °Self-Help/Support Groups °Organization         Address  Phone             Notes  °[facility] of  - variety of support groups  336- 373-1402 Call for more information  °Narcotics Anonymous (NA), [facility] [address], °[place]  2 meetings at this location  ° °  Residential Treatment Programs Organization         Address  Phone  Notes  [facility] [address],    [place]  [phone]   [facility]  [address], [place] [phone], [place], [place] [phone]) [phone]   [facility] [address], [place] [phone] Admissions: 8am-3pm M-F  [facility] [address]    [place], [place] [phone]   [facility] [address], [place], [place] [phone]   [facility] [address]  [place], [place] [phone]   [facility] Intensive Outpatient [address], [place], [place] [phone]   [facility] ([facility] [address]  [place], [place] [phone] or [phone]   [facility] [address] [place], [place] [phone] Accepts Medicaid  [facility] [address]  [place] [phone]) [phone] Substance Abuse/Addiction Treatment   [facility] Organization         Address  Phone  Notes  [facility]  (347) 720-8870(888) (867)[phone]   Angie FavaJulie Brannon, PhD [address], [address] [place], [place]   ([phone](336) ([phone]) [phone] or [phone])[phone](336) ([phone]   [facility]   [address] [place], [place] [phone](336) [phone]   [facility] [address], [place], [place] [phone]) [phone] Insurance/Medicaid/sponsorship through [facility] [address]                                    [place], [place] [phone]) [phone] Therapy/tele-psych/case    [facility] [address]   [place], [place] [phone]) [phone]    Dr. Lolly MustacheArfeen  [phone]) [phone]   [facility] [place]  [employer] [facility] 1) [address], [place] 2) [address], [place] 3)  [address], [place] [phone](336) [phone]) [phone]  [phone]) [phone]   [facility] Child Abuse Hot[facility] [phone](336) [phone] or [phone](336) [phone] (After Hours)      If you were given medicines take as directed.  If you are on coumadin or contraceptives realize their levels and effectiveness is altered by many different medicines.  If you have any reaction (rash, tongues swelling, other) to the medicines stop taking and see a physician.   Please follow up as directed and return to the ER or see a physician for new or worsening symptoms (suicidal thoughts, homicidal thoughts or plan).  Thank you.

## 2013-06-22 NOTE — ED Notes (Signed)
Pt A&Ox4, ambulatory at discharge, verbalizing no complaints at this time. 
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
## 2013-06-22 NOTE — ED Notes (Signed)
Patient also admits to etoh use/abuse. States he drinks anywhere from a few shots to a half gallon. States and doesn't drink when he is working. States he works wed [employer].

## 2013-06-22 NOTE — ED Notes (Signed)
telepsych done 

## 2013-06-22 NOTE — BH Assessment (Signed)
Tele Assessment completed with patient and discussed with AC Thurman CoyerEric Kaplan and Donell SievertSpencer Simon PA. Patient presenting with depression following family stressors, denies both SI and HI. Recommending patient follow up with Mental Health [facility] Out[facility] [place]. Patient somewhat reluctant due to distance from [place] in [place], yet willing to consider the program in addition to agreeing to [facility]up [facility] at [facility].  Patient agreeable to sign no harm contract which is being faxed to [employer] Danielle at [facility]. She will provide patient one cope and put one copy in medical record. Dr Jodi MourningZavitz informed of disposition.  Carney Bernatherine C Harrill, LCSW
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
## 2014-06-24 IMAGING — US US EXTREM LOW VENOUS BILAT
1 series · 14 of 24 positions shown · non-contrast
Comparison: None.

CLINICAL DATA: Pulmonary embolism.

VENOUS DUPLEX ULTRASOUND OF BILATERAL LOWER EXTREMITIES
TECHNIQUE: Gray-scale sonography with graded compression, as well
as color Doppler and duplex ultrasound, were performed to evaluate
the deep venous system of both lower extremities from the level of
the common femoral vein through the popliteal and proximal calf
veins.  Spectral Doppler was utilized to evaluate flow at rest and
with distal augmentation maneuvers.

[Series 1: us extrem low venous bilat · 14 of 57 slices shown]
[im 1/57]
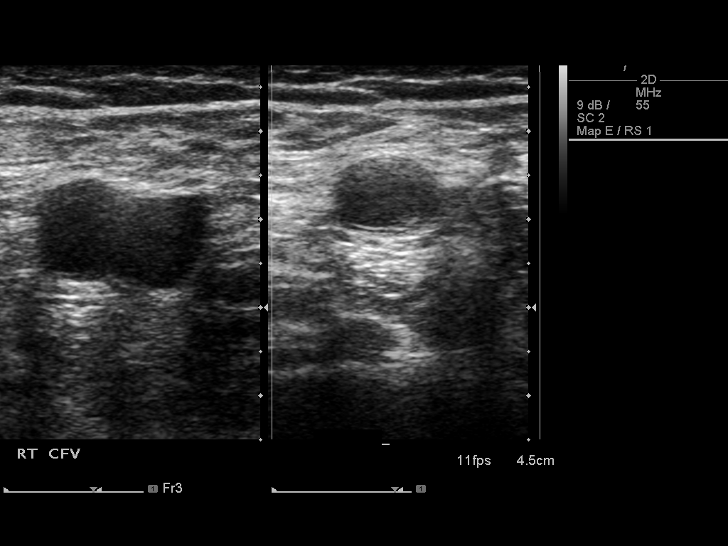
[im 5/57]
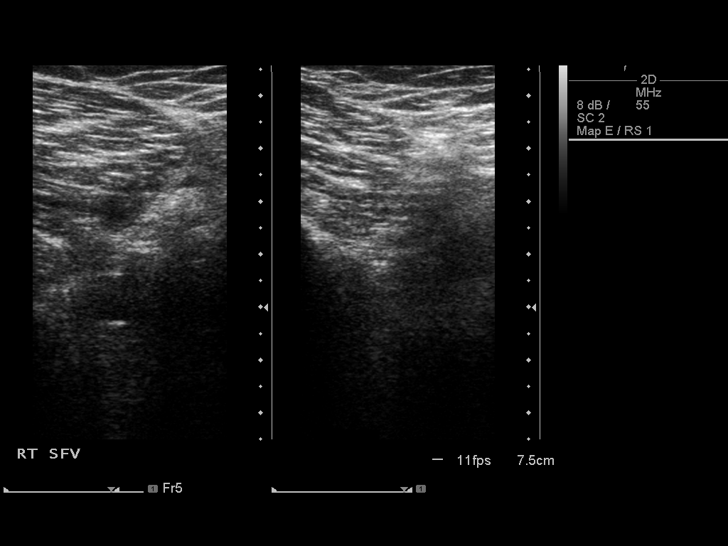
[im 10/57]
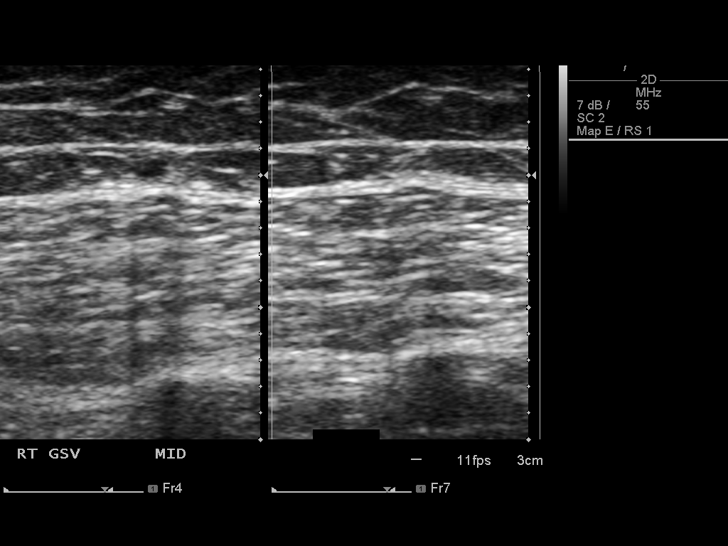
[im 15/57]
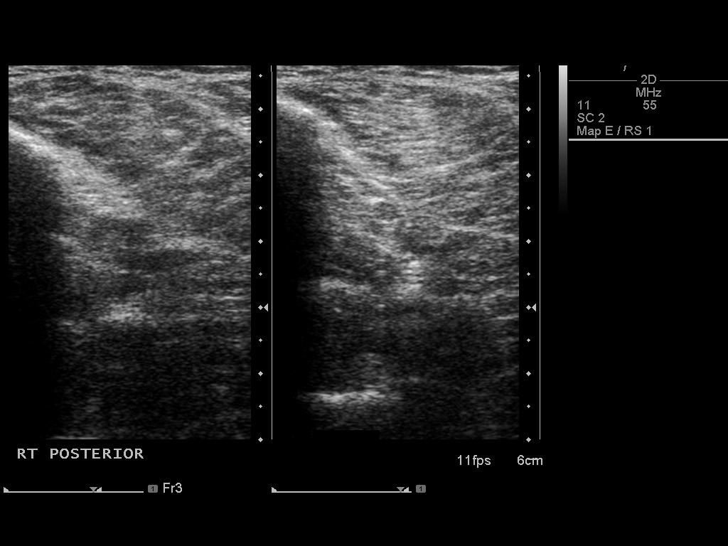
[im 18/57]
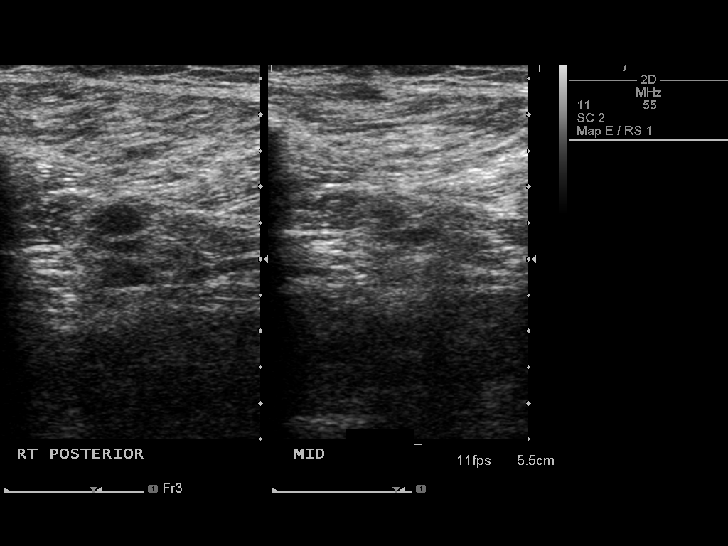
[im 22/57]
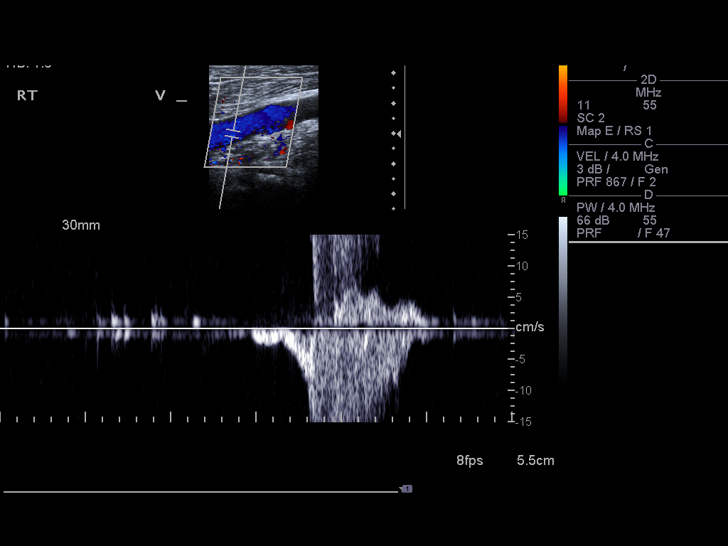
[im 27/57]
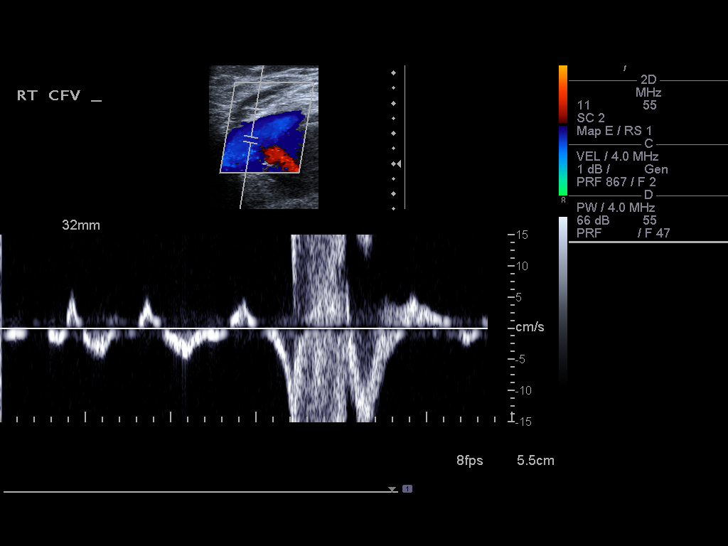
[im 30/57]
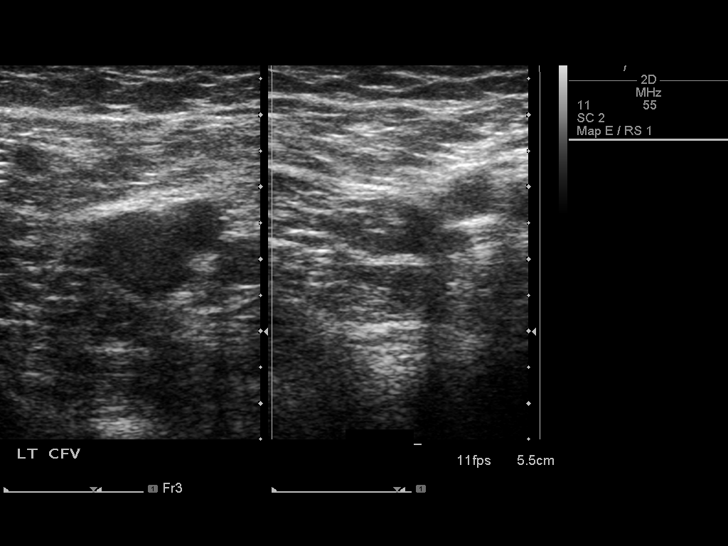
[im 35/57]
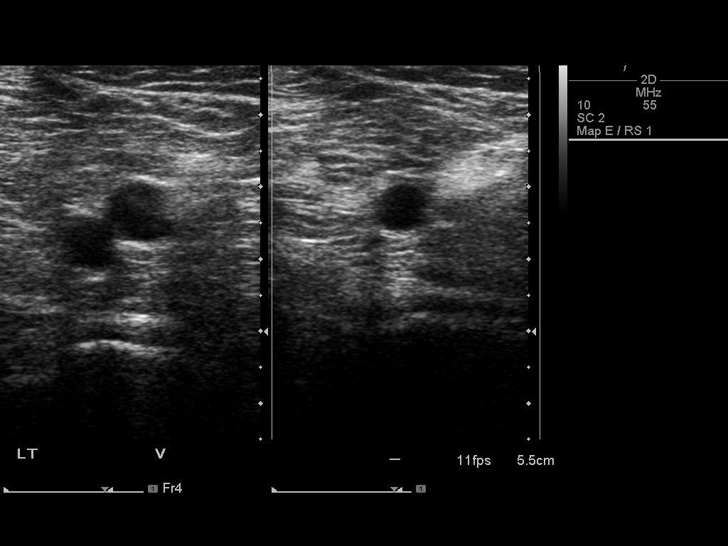
[im 39/57]
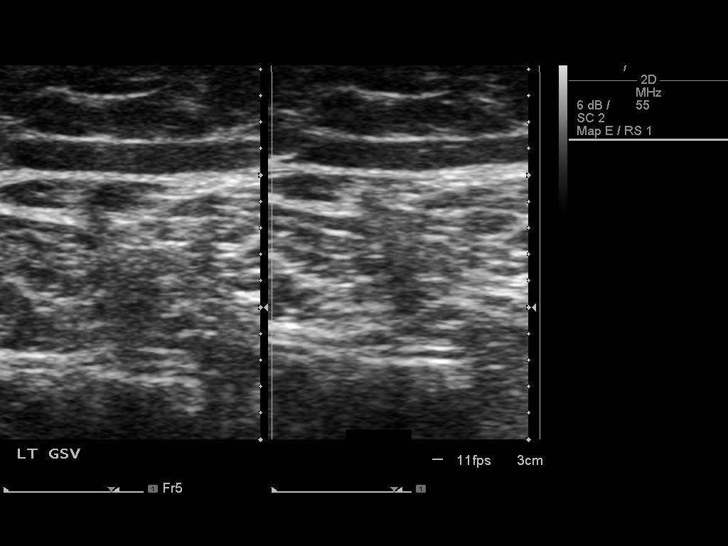
[im 44/57]
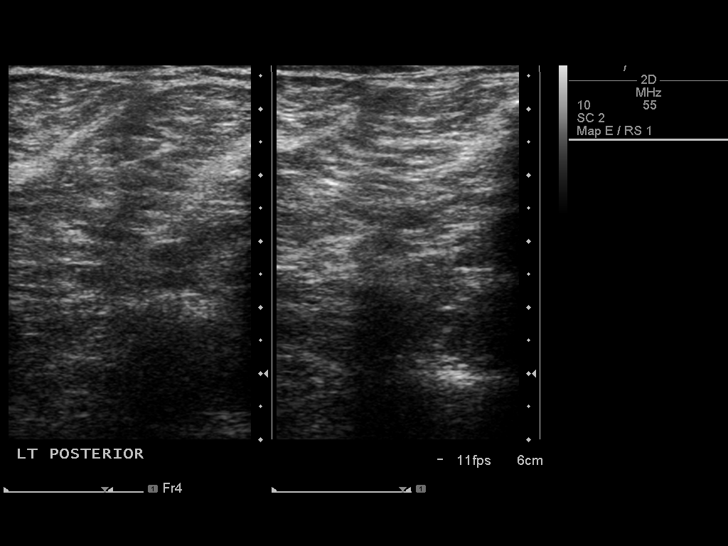
[im 47/57]
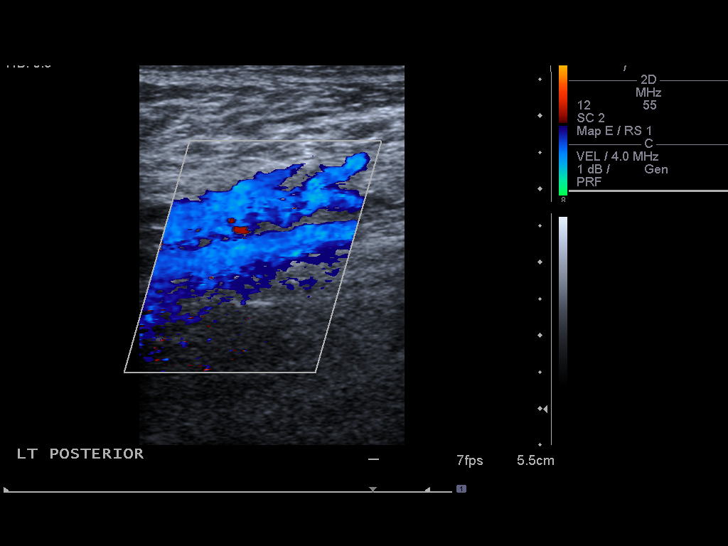
[im 52/57]
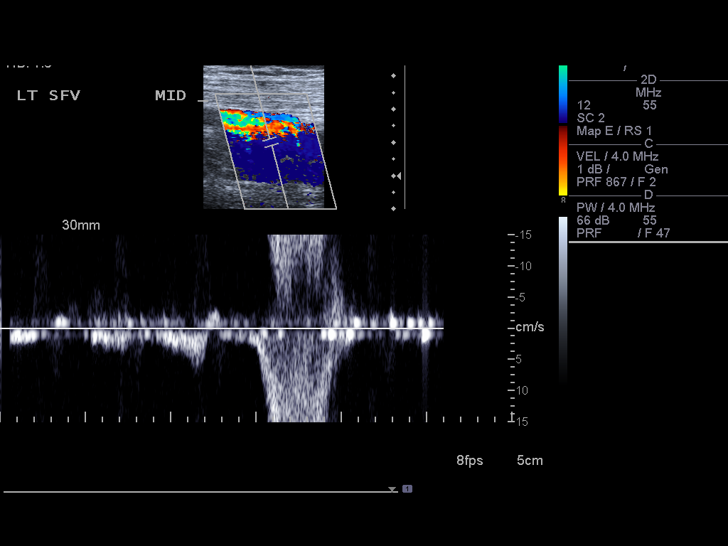
[im 57/57]
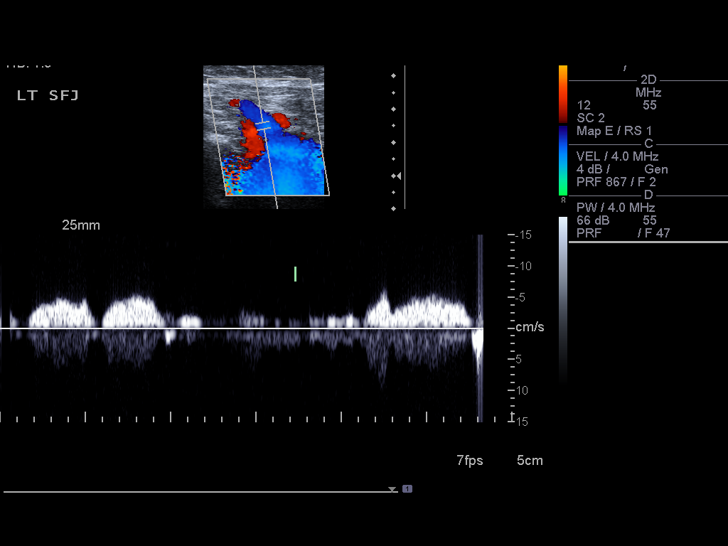

[14 of 24 positions shown; findings below may reference images not displayed]

FINDINGS: There is patent flow in the common femoral, profunda
femoral, superficial femoral and popliteal veins.  These vessels
were completely compressible and demonstrate increased flow with
augmentation.
IMPRESSION: Negative venous Doppler examination for deep venous thrombosis.

## 2014-06-24 IMAGING — CT CT ABD-PELV W/ CM
2 of 4 series · 17 of 46 positions shown, 19 images · IV contrast (READICAT/WATER & [ID] OMNI 300)
Comparison: Chest CT with incomplete imaging of the upper abdomen,
03/27/2012, at [HOSPITAL]

CLINICAL DATA: Recently diagnosed pulmonary embolism, clinical
concern for underlying malignancy or other abnormality predisposing
to embolism.

CT ABDOMEN AND PELVIS WITH CONTRAST
TECHNIQUE: Multidetector CT imaging of the abdomen and pelvis was
performed following the standard protocol during bolus
administration of intravenous contrast.
Contrast: 100mL OMNIPAQUE IOHEXOL 300 MG/ML  SOLN

[Series 2: abd/pelvis with · axial · 0.70mm/px · z∈[-375,+20]mm · 14 of 87 slices shown, 16 images]
[im 4/87  soft-tissue]
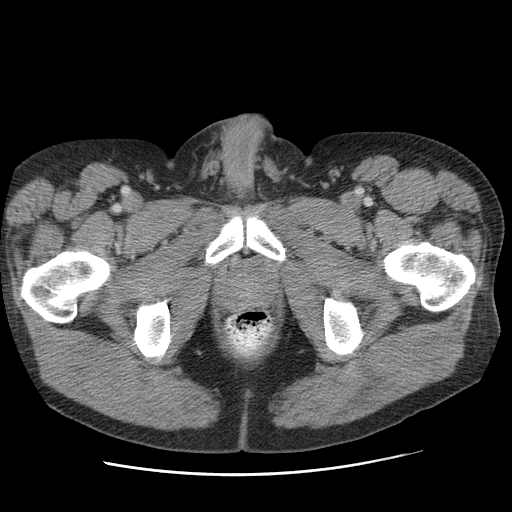
[im 4/87  bone]
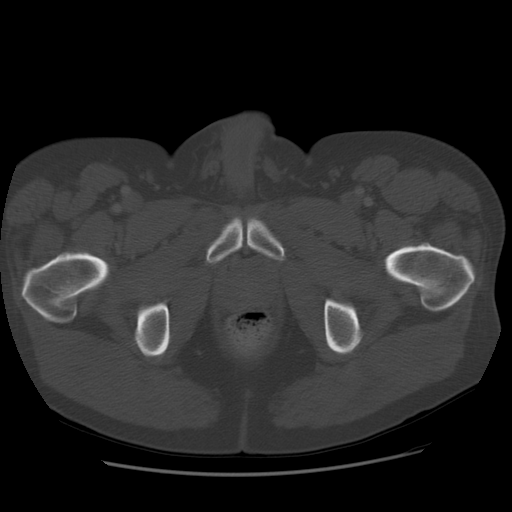
[im 11/87  soft-tissue]
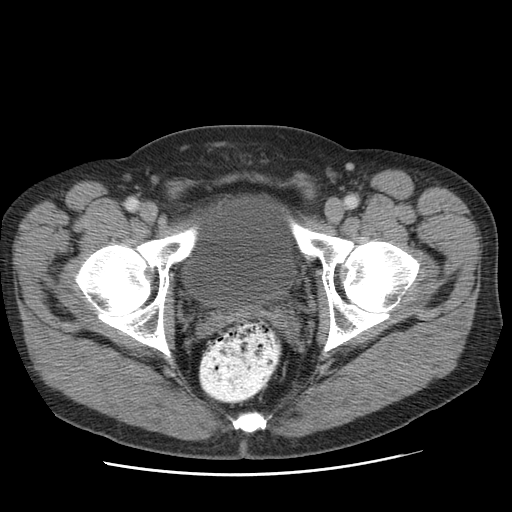
[im 18/87  soft-tissue]
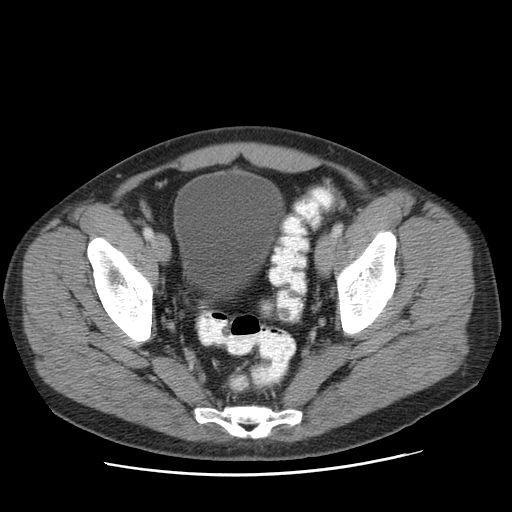
[im 25/87  soft-tissue]
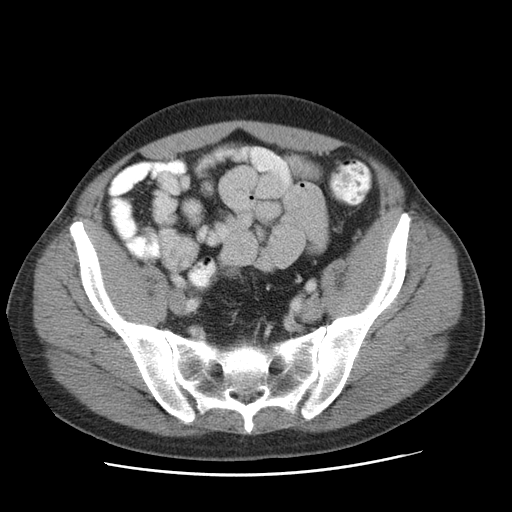
[im 28/87  soft-tissue]
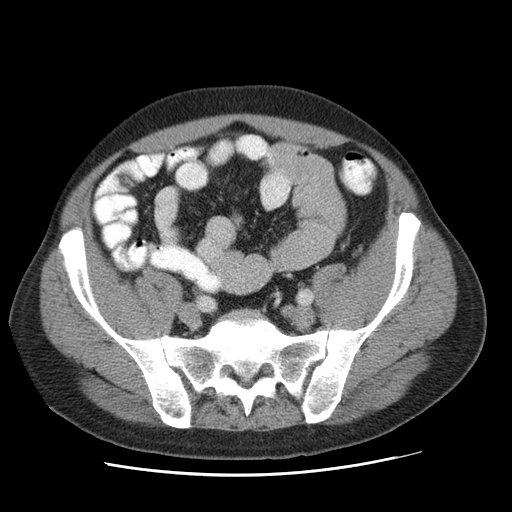
[im 35/87  soft-tissue]
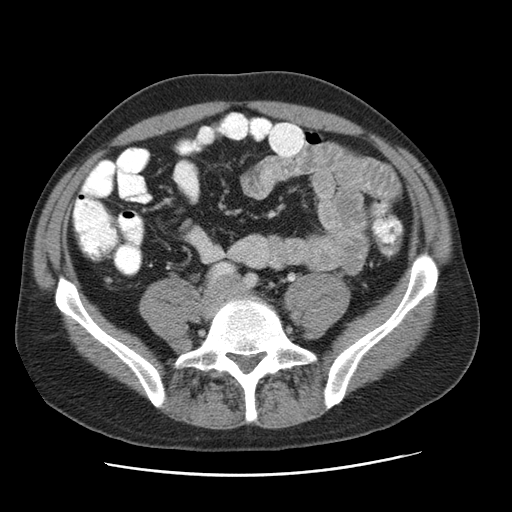
[im 42/87  soft-tissue]
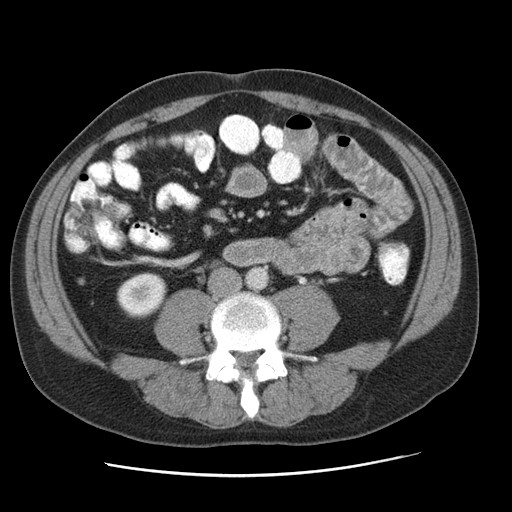
[im 45/87  soft-tissue]
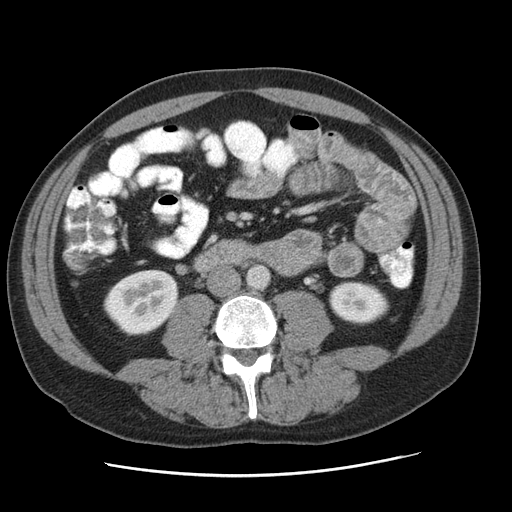
[im 52/87  soft-tissue]
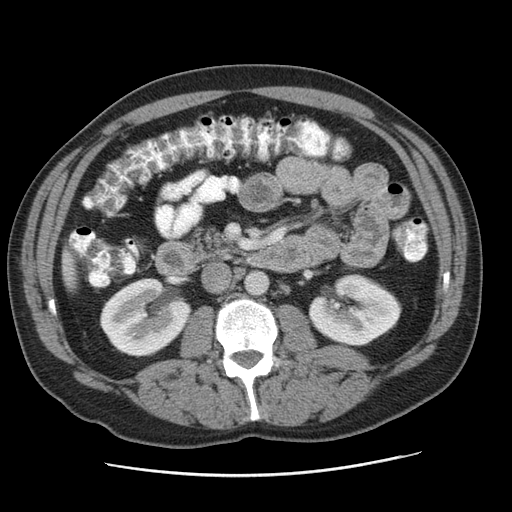
[im 52/87  bone]
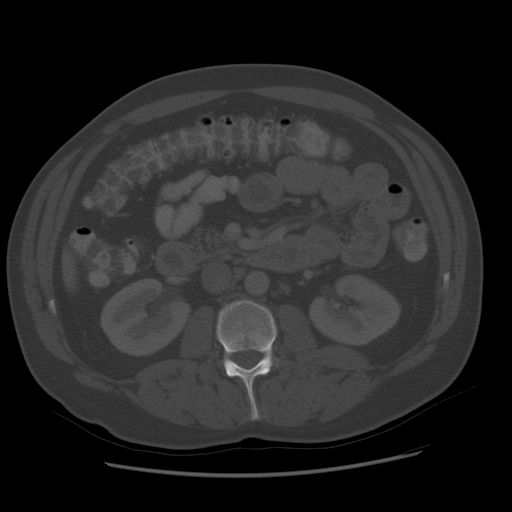
[im 59/87  soft-tissue]
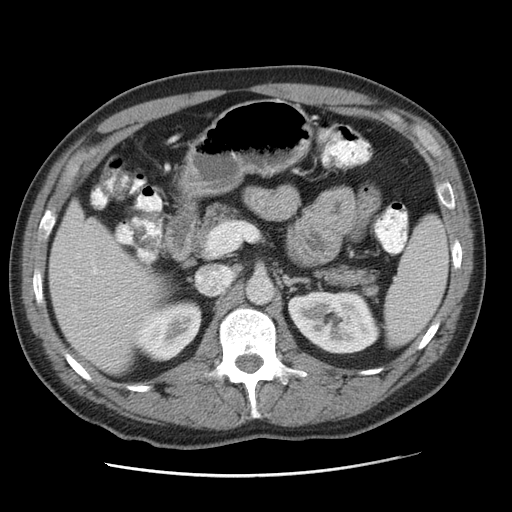
[im 66/87  soft-tissue]
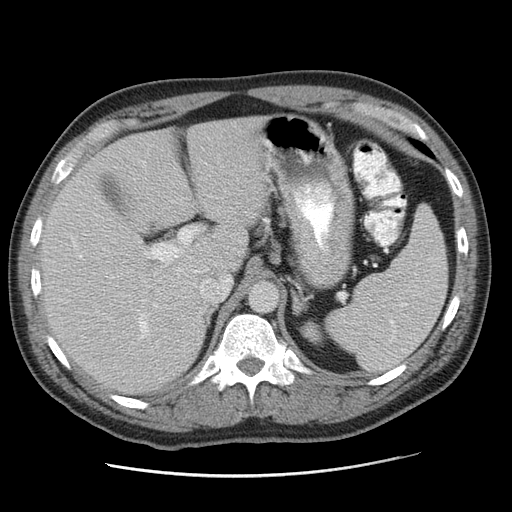
[im 69/87  soft-tissue]
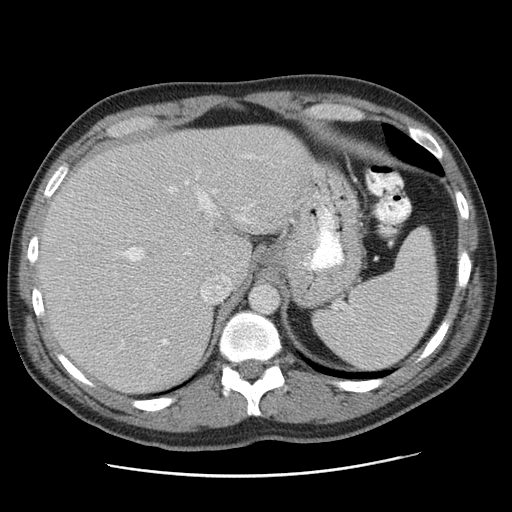
[im 76/87  soft-tissue]
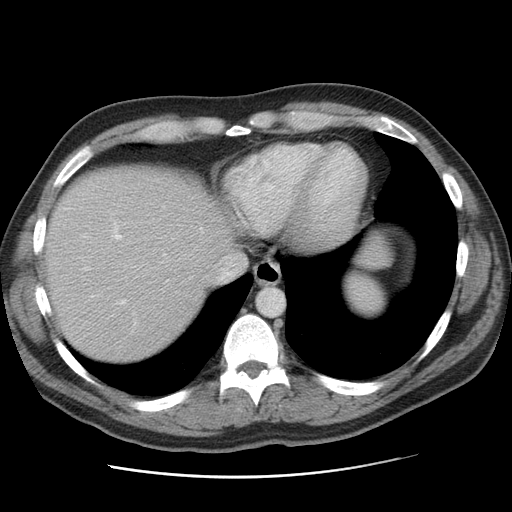
[im 83/87  soft-tissue]
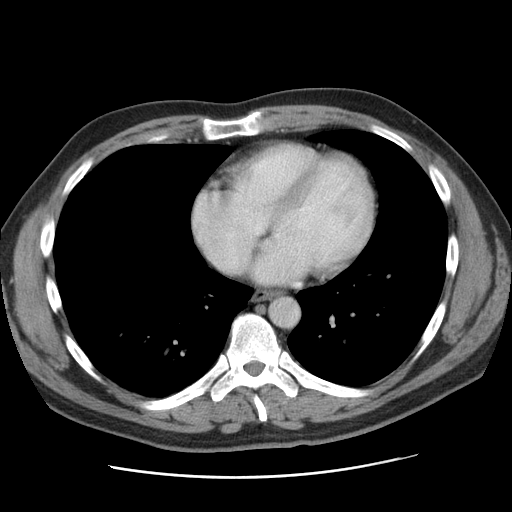

[Series 400: cor · coronal · 0.99mm/px · 3 of 113 slices shown]
[im 38/113  soft-tissue]
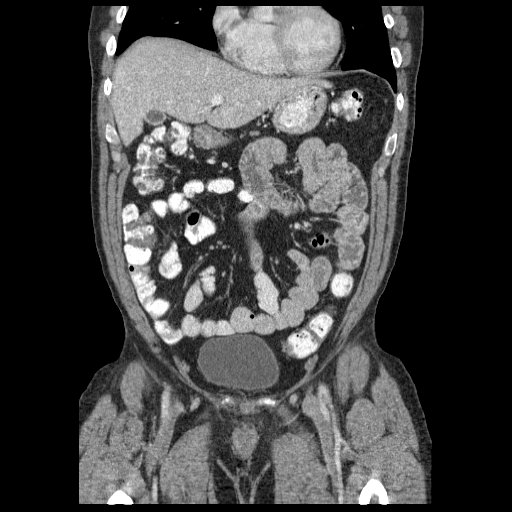
[im 50/113  soft-tissue]
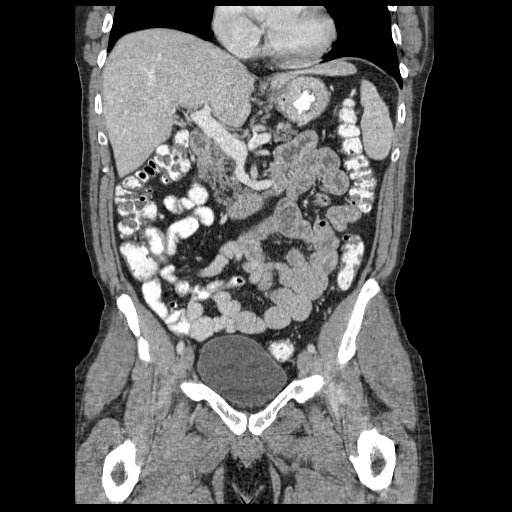
[im 63/113  soft-tissue]
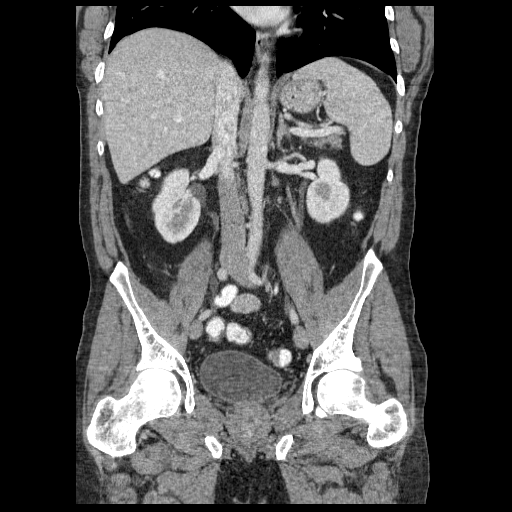

[17 of 46 positions shown; findings below may reference images not displayed]

FINDINGS: Abdominal viscera are unremarkable.  No ascites,
lymphadenopathy, or free air.  Incidental note made of 6 mm
gastrohepatic node image 23.

No bowel wall thickening or focal segmental dilatation.  Appendix
is normal.  Bladder is normal.  No pelvic free fluid or
lymphadenopathy.

No lytic or sclerotic osseous lesion.  No acute osseous
abnormality.
IMPRESSION: No acute or chronic intra-abdominal or pelvic pathology.
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
## 2019-01-20 ENCOUNTER — Emergency Department (HOSPITAL_COMMUNITY)
Admission: EM | Admit: 2019-01-20 | Discharge: 2019-01-20 | Disposition: A | Discharge status: Home / Self Care | Payer: Managed Care, Other (non HMO) | Attending: Emergency Medicine | Admitting: Emergency Medicine

## 2019-01-20 ENCOUNTER — Encounter (HOSPITAL_COMMUNITY): Payer: Self-pay

## 2019-01-20 DIAGNOSIS — Y929 Unspecified place or not applicable: Secondary | ICD-10-CM | POA: Insufficient documentation

## 2019-01-20 DIAGNOSIS — Y939 Activity, unspecified: Secondary | ICD-10-CM | POA: Insufficient documentation

## 2019-01-20 DIAGNOSIS — S0990XA Unspecified injury of head, initial encounter: Secondary | ICD-10-CM | POA: Insufficient documentation

## 2019-01-20 DIAGNOSIS — Y999 Unspecified external cause status: Secondary | ICD-10-CM | POA: Insufficient documentation

## 2019-01-20 DIAGNOSIS — Z5321 Procedure and treatment not carried out due to patient leaving prior to being seen by health care provider: Secondary | ICD-10-CM | POA: Insufficient documentation

## 2019-01-20 HISTORY — DX: Essential (primary) hypertension: I10
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
## 2022-12-22 ENCOUNTER — Encounter (HOSPITAL_COMMUNITY): Payer: Self-pay | Admitting: Emergency Medicine

## 2022-12-22 ENCOUNTER — Other Ambulatory Visit: Payer: Self-pay

## 2022-12-22 ENCOUNTER — Inpatient Hospital Stay (HOSPITAL_COMMUNITY)
Admission: EM | Admit: 2022-12-22 | Discharge: 2022-12-24 | DRG: 322 | Disposition: A | Discharge status: Home / Self Care | Payer: Medicaid Other | Attending: Cardiovascular Disease | Admitting: Cardiovascular Disease

## 2022-12-22 ENCOUNTER — Emergency Department (HOSPITAL_COMMUNITY): Payer: Medicaid Other

## 2022-12-22 DIAGNOSIS — Z8249 Family history of ischemic heart disease and other diseases of the circulatory system: Secondary | ICD-10-CM

## 2022-12-22 DIAGNOSIS — I10 Essential (primary) hypertension: Secondary | ICD-10-CM | POA: Diagnosis present

## 2022-12-22 DIAGNOSIS — F17210 Nicotine dependence, cigarettes, uncomplicated: Secondary | ICD-10-CM | POA: Diagnosis present

## 2022-12-22 DIAGNOSIS — R001 Bradycardia, unspecified: Secondary | ICD-10-CM | POA: Diagnosis present

## 2022-12-22 DIAGNOSIS — Z7982 Long term (current) use of aspirin: Secondary | ICD-10-CM

## 2022-12-22 DIAGNOSIS — R079 Chest pain, unspecified: Principal | ICD-10-CM

## 2022-12-22 DIAGNOSIS — Z86711 Personal history of pulmonary embolism: Secondary | ICD-10-CM

## 2022-12-22 DIAGNOSIS — R072 Precordial pain: Secondary | ICD-10-CM | POA: Diagnosis not present

## 2022-12-22 DIAGNOSIS — I214 Non-ST elevation (NSTEMI) myocardial infarction: Principal | ICD-10-CM | POA: Diagnosis present

## 2022-12-22 DIAGNOSIS — Z79899 Other long term (current) drug therapy: Secondary | ICD-10-CM

## 2022-12-22 DIAGNOSIS — I25110 Atherosclerotic heart disease of native coronary artery with unstable angina pectoris: Secondary | ICD-10-CM | POA: Diagnosis present

## 2022-12-22 DIAGNOSIS — Z955 Presence of coronary angioplasty implant and graft: Secondary | ICD-10-CM

## 2022-12-22 DIAGNOSIS — E785 Hyperlipidemia, unspecified: Secondary | ICD-10-CM | POA: Diagnosis present

## 2022-12-22 DIAGNOSIS — Z720 Tobacco use: Secondary | ICD-10-CM

## 2022-12-22 DIAGNOSIS — R030 Elevated blood-pressure reading, without diagnosis of hypertension: Secondary | ICD-10-CM | POA: Insufficient documentation

## 2022-12-22 LAB — BASIC METABOLIC PANEL
Anion gap: 10 (ref 5–15)
BUN: 8 mg/dL (ref 6–20)
CO2: 24 mmol/L (ref 22–32)
Calcium: 8.6 mg/dL — ABNORMAL LOW (ref 8.9–10.3)
Chloride: 103 mmol/L (ref 98–111)
Creatinine, Ser: 0.8 mg/dL (ref 0.61–1.24)
GFR, Estimated: >60 mL/min (ref >60)
Glucose, Bld: 90 mg/dL (ref 70–99)
Potassium: 3.5 mmol/L (ref 3.5–5.1)
Sodium: 137 mmol/L (ref 135–145)

## 2022-12-22 LAB — CBC
HCT: 41.9 % (ref 39.0–52.0)
Hemoglobin: 14.5 g/dL (ref 13.0–17.0)
MCH: 33.6 pg (ref 26.0–34.0)
MCHC: 34.6 g/dL (ref 30.0–36.0)
MCV: 97.2 fL (ref 80.0–100.0)
Platelets: 267 10*3/uL (ref 150–400)
RBC: 4.31 MIL/uL (ref 4.22–5.81)
RDW: 13 % (ref 11.5–15.5)
WBC: 10.8 10*3/uL — ABNORMAL HIGH (ref 4.0–10.5)
nRBC: 0 % (ref 0.0–0.2)

## 2022-12-22 LAB — TROPONIN I (HIGH SENSITIVITY)
Troponin I (High Sensitivity): 41 ng/L — ABNORMAL HIGH (ref <18)
Troponin I (High Sensitivity): 47 ng/L — ABNORMAL HIGH (ref <18)

## 2022-12-22 MED ORDER — ASPIRIN 325 MG PO TABS
325.0000 mg | ORAL_TABLET | Freq: Every day | ORAL | Status: DC
  Administered 2022-12-22: 325 mg via ORAL
  Filled 2022-12-22: qty 1

## 2022-12-22 NOTE — ED Provider Notes (Signed)
[place] EMERGENCY DEPARTMENT AT [facility] Provider Note   CSN: [id number] Arrival date & time: 12/22/22  1843     History  Chief Complaint  Patient presents with   Chest Pain    Thomas Mckenzie is a 52 y.o. male.  This is a 52 year old male does not regularly seek medical care presents emergency department today due to chest pain on exertion.  Patient says that he has noticed this while he has been working the last few days.  He does not have any pain at rest.  Patient has not been to a doctor in many years.  His brother had a heart attack 2 years ago.  He has not had any shortness of breath or nausea.   Chest Pain      Home Medications Prior to Admission medications   Medication Sig Start Date End Date Taking? Authorizing Provider  Aspirin-Acetaminophen-Caffeine (GOODY HEADACHE PO) Take 2 Packages by mouth daily as needed (pain).    [provider]      Allergies    Patient has no known allergies.    Review of Systems   Review of Systems  Cardiovascular:  Positive for chest pain.    Physical Exam Updated Vital Signs BP (!) 170/104   Pulse 74   Temp 99.1 F (37.3 C) (Oral)   Resp 19   Ht 5\' 10"  (1.778 m)   Wt 86.2 kg   SpO2 98%   BMI 27.26 kg/m  Physical Exam Vitals reviewed.  Eyes:     Pupils: Pupils are equal, round, and reactive to light.  Cardiovascular:     Rate and Rhythm: Normal rate and regular rhythm.     Heart sounds: Normal heart sounds.  Pulmonary:     Effort: Pulmonary effort is normal. No respiratory distress.  Musculoskeletal:     Cervical back: Normal range of motion.  Neurological:     Mental Status: He is alert.     ED Results / Procedures / Treatments   Labs (all labs ordered are listed, but only abnormal results are displayed) Labs Reviewed  BASIC METABOLIC PANEL - Abnormal; Notable for the following components:      Result Value   Calcium 8.6 (*)    All other components within normal limits  CBC -  Abnormal; Notable for the following components:   WBC 10.8 (*)    All other components within normal limits  TROPONIN I (HIGH SENSITIVITY) - Abnormal; Notable for the following components:   Troponin I (High Sensitivity) 41 (*)    All other components within normal limits  TROPONIN I (HIGH SENSITIVITY) - Abnormal; Notable for the following components:   Troponin I (High Sensitivity) 47 (*)    All other components within normal limits    EKG EKG Interpretation Date/Time:  Monday December 22 2022 19:09:57 EDT Ventricular Rate:  77 PR Interval:  159 QRS Duration:  98 QT Interval:  381 QTC Calculation: 432 R Axis:   50  Text Interpretation: Sinus rhythm RSR' in V1 or V2, right VCD or RVH Borderline T abnormalities, anterior leads Confirmed by Anders Simmonds [id number]) on 12/22/2022 10:56:23 PM  Radiology DG Chest Portable 1 View  Result Date: 12/22/2022 CLINICAL DATA:  Midsternal chest pain. EXAM: PORTABLE CHEST 1 VIEW COMPARISON:  March 27, 2012 FINDINGS: The heart size and mediastinal contours are within normal limits. There is no evidence of an acute infiltrate, pleural effusion or pneumothorax. The visualized skeletal structures are unremarkable. IMPRESSION: No active disease.  Electronically Signed   By: Aram Candela M.D.   On: 12/22/2022 21:22    Procedures Procedures    Medications Ordered in ED Medications  aspirin tablet 325 mg (325 mg Oral Given 12/22/22 2208)    ED Course/ Medical Decision Making/ A&P Clinical Course as of 12/22/22 2339  [facility] Dec 22, 2022  2321 Troponin I (High Sensitivity)(!): 47 [JS]    Clinical Course User Index [JS] Arletha Pili, DO                                 Medical Decision Making 52 year old male here today for chest pain on exertion.  Differential diagnoses include stable angina, ACS, less likely dissection, less likely PE.  Plan- patient's initial EKG does not show any evidence of ischemia.  No ST segment depressions or  elevations, no T wave inversions.  Patient story is concerning for stable angina.  He does have strong family history of coronary artery disease.  Unclear if the patient has his own personal risk factors as he does not regularly seek medical care.  My dependent review the patient's chest x-ray shows no pneumonia.  Initial troponin elevated at 41, delta troponin 47.  Patient has received aspirin.  Will plan to admit patient for ACS observation.  Have messaged cardiology.  Amount and/or Complexity of Data Reviewed Labs: ordered. Decision-making details documented in ED Course. Radiology: ordered.  Risk OTC drugs.           Final Clinical Impression(s) / ED Diagnoses Final diagnoses:  Chest pain, unspecified type    Rx / DC Orders ED Discharge Orders     None         Arletha Pili, DO 12/22/22 2339

## 2022-12-22 NOTE — ED Triage Notes (Signed)
Pt presents with mid-sternal CP, since April 2024, hasn't seen a primary doctor in 10 years, BP elevated in triage.

## 2022-12-23 ENCOUNTER — Other Ambulatory Visit (HOSPITAL_COMMUNITY): Payer: Managed Care, Other (non HMO)

## 2022-12-23 ENCOUNTER — Encounter (HOSPITAL_COMMUNITY)
Admission: EM | Disposition: A | Discharge status: Home / Self Care | Payer: Self-pay | Source: Home / Self Care | Attending: Internal Medicine

## 2022-12-23 DIAGNOSIS — I214 Non-ST elevation (NSTEMI) myocardial infarction: Secondary | ICD-10-CM | POA: Diagnosis not present

## 2022-12-23 DIAGNOSIS — R001 Bradycardia, unspecified: Secondary | ICD-10-CM | POA: Diagnosis present

## 2022-12-23 DIAGNOSIS — Z720 Tobacco use: Secondary | ICD-10-CM

## 2022-12-23 DIAGNOSIS — R079 Chest pain, unspecified: Secondary | ICD-10-CM | POA: Diagnosis present

## 2022-12-23 DIAGNOSIS — Z86711 Personal history of pulmonary embolism: Secondary | ICD-10-CM | POA: Diagnosis not present

## 2022-12-23 DIAGNOSIS — I10 Essential (primary) hypertension: Secondary | ICD-10-CM

## 2022-12-23 DIAGNOSIS — Z8249 Family history of ischemic heart disease and other diseases of the circulatory system: Secondary | ICD-10-CM | POA: Diagnosis not present

## 2022-12-23 DIAGNOSIS — I2510 Atherosclerotic heart disease of native coronary artery without angina pectoris: Secondary | ICD-10-CM

## 2022-12-23 DIAGNOSIS — E785 Hyperlipidemia, unspecified: Secondary | ICD-10-CM | POA: Diagnosis present

## 2022-12-23 DIAGNOSIS — I200 Unstable angina: Secondary | ICD-10-CM

## 2022-12-23 DIAGNOSIS — R030 Elevated blood-pressure reading, without diagnosis of hypertension: Secondary | ICD-10-CM | POA: Diagnosis not present

## 2022-12-23 DIAGNOSIS — Z7982 Long term (current) use of aspirin: Secondary | ICD-10-CM | POA: Diagnosis not present

## 2022-12-23 DIAGNOSIS — Z79899 Other long term (current) drug therapy: Secondary | ICD-10-CM | POA: Diagnosis not present

## 2022-12-23 DIAGNOSIS — I25110 Atherosclerotic heart disease of native coronary artery with unstable angina pectoris: Secondary | ICD-10-CM | POA: Diagnosis present

## 2022-12-23 DIAGNOSIS — F17210 Nicotine dependence, cigarettes, uncomplicated: Secondary | ICD-10-CM | POA: Diagnosis present

## 2022-12-23 HISTORY — PX: CORONARY IMAGING/OCT: CATH118326

## 2022-12-23 HISTORY — PX: LEFT HEART CATH AND CORONARY ANGIOGRAPHY: CATH118249

## 2022-12-23 HISTORY — PX: CORONARY STENT INTERVENTION: CATH118234

## 2022-12-23 LAB — COMPREHENSIVE METABOLIC PANEL
ALT: 32 U/L (ref 0–44)
AST: 25 U/L (ref 15–41)
Albumin: 3.6 g/dL (ref 3.5–5.0)
Alkaline Phosphatase: 90 U/L (ref 38–126)
Anion gap: 8 (ref 5–15)
BUN: 8 mg/dL (ref 6–20)
CO2: 25 mmol/L (ref 22–32)
Calcium: 8.4 mg/dL — ABNORMAL LOW (ref 8.9–10.3)
Chloride: 104 mmol/L (ref 98–111)
Creatinine, Ser: 0.87 mg/dL (ref 0.61–1.24)
GFR, Estimated: >60 mL/min (ref >60)
Glucose, Bld: 97 mg/dL (ref 70–99)
Potassium: 3.6 mmol/L (ref 3.5–5.1)
Sodium: 137 mmol/L (ref 135–145)
Total Bilirubin: 0.8 mg/dL (ref 0.3–1.2)
Total Protein: 6.8 g/dL (ref 6.5–8.1)

## 2022-12-23 LAB — CBC
HCT: 43.3 % (ref 39.0–52.0)
Hemoglobin: 14.8 g/dL (ref 13.0–17.0)
MCH: 33.7 pg (ref 26.0–34.0)
MCHC: 34.2 g/dL (ref 30.0–36.0)
MCV: 98.6 fL (ref 80.0–100.0)
Platelets: 264 10*3/uL (ref 150–400)
RBC: 4.39 MIL/uL (ref 4.22–5.81)
RDW: 13.2 % (ref 11.5–15.5)
WBC: 9.4 10*3/uL (ref 4.0–10.5)
nRBC: 0 % (ref 0.0–0.2)

## 2022-12-23 LAB — MAGNESIUM: Magnesium: 2.3 mg/dL (ref 1.7–2.4)

## 2022-12-23 LAB — TROPONIN I (HIGH SENSITIVITY)
Troponin I (High Sensitivity): 49 ng/L — ABNORMAL HIGH (ref <18)
Troponin I (High Sensitivity): 49 ng/L — ABNORMAL HIGH (ref <18)

## 2022-12-23 LAB — LIPID PANEL
Cholesterol: 176 mg/dL (ref 0–200)
HDL: 40 mg/dL — ABNORMAL LOW (ref >40)
LDL Cholesterol: 116 mg/dL — ABNORMAL HIGH (ref 0–99)
Total CHOL/HDL Ratio: 4.4 ratio
Triglycerides: 99 mg/dL (ref <150)
VLDL: 20 mg/dL (ref 0–40)

## 2022-12-23 LAB — PHOSPHORUS: Phosphorus: 3.1 mg/dL (ref 2.5–4.6)

## 2022-12-23 LAB — HIV ANTIBODY (ROUTINE TESTING W REFLEX): HIV Screen 4th Generation wRfx: NONREACTIVE

## 2022-12-23 LAB — POCT ACTIVATED CLOTTING TIME
Activated Clotting Time: 293 s
Activated Clotting Time: 238 s

## 2022-12-23 SURGERY — LEFT HEART CATH AND CORONARY ANGIOGRAPHY
Anesthesia: LOCAL

## 2022-12-23 MED ORDER — FENTANYL CITRATE (PF) 100 MCG/2ML IJ SOLN
INTRAMUSCULAR | Status: DC | PRN
  Administered 2022-12-23 (×3): 25 ug via INTRAVENOUS

## 2022-12-23 MED ORDER — ENOXAPARIN SODIUM 40 MG/0.4ML IJ SOSY
40.0000 mg | PREFILLED_SYRINGE | INTRAMUSCULAR | Status: DC
  Administered 2022-12-23 – 2022-12-24 (×2): 40 mg via SUBCUTANEOUS
  Filled 2022-12-23 (×2): qty 0.4

## 2022-12-23 MED ORDER — SODIUM CHLORIDE 0.9 % IV SOLN: INTRAVENOUS | Status: AC

## 2022-12-23 MED ORDER — ASPIRIN 81 MG PO CHEW: 81.0000 mg | CHEWABLE_TABLET | ORAL | Status: DC

## 2022-12-23 MED ORDER — VERAPAMIL HCL 2.5 MG/ML IV SOLN
INTRAVENOUS | Status: DC | PRN
  Administered 2022-12-23: 10 mL via INTRA_ARTERIAL

## 2022-12-23 MED ORDER — ONDANSETRON HCL 4 MG/2ML IJ SOLN: 4.0000 mg | Freq: Four times a day (QID) | INTRAMUSCULAR | Status: DC | PRN

## 2022-12-23 MED ORDER — SODIUM CHLORIDE 0.9% FLUSH
3.0000 mL | Freq: Two times a day (BID) | INTRAVENOUS | Status: DC
  Administered 2022-12-24: 3 mL via INTRAVENOUS

## 2022-12-23 MED ORDER — LIDOCAINE HCL (PF) 1 % IJ SOLN
INTRAMUSCULAR | Status: DC | PRN
  Administered 2022-12-23: 2 mL

## 2022-12-23 MED ORDER — LABETALOL HCL 5 MG/ML IV SOLN
10.0000 mg | INTRAVENOUS | Status: AC | PRN
  Administered 2022-12-23: 10 mg via INTRAVENOUS
  Filled 2022-12-23: qty 4

## 2022-12-23 MED ORDER — HYDRALAZINE HCL 20 MG/ML IJ SOLN
INTRAMUSCULAR | Status: AC
  Filled 2022-12-23: qty 1

## 2022-12-23 MED ORDER — SODIUM CHLORIDE 0.9 % WEIGHT BASED INFUSION: 1.0000 mL/kg/h | INTRAVENOUS | Status: DC

## 2022-12-23 MED ORDER — SODIUM CHLORIDE 0.9% FLUSH: 3.0000 mL | INTRAVENOUS | Status: DC | PRN

## 2022-12-23 MED ORDER — FENTANYL CITRATE (PF) 100 MCG/2ML IJ SOLN
INTRAMUSCULAR | Status: AC
  Filled 2022-12-23: qty 2

## 2022-12-23 MED ORDER — HYDRALAZINE HCL 20 MG/ML IJ SOLN: 10.0000 mg | INTRAMUSCULAR | Status: AC | PRN

## 2022-12-23 MED ORDER — HEPARIN SODIUM (PORCINE) 1000 UNIT/ML IJ SOLN
INTRAMUSCULAR | Status: AC
  Filled 2022-12-23: qty 10

## 2022-12-23 MED ORDER — ATORVASTATIN CALCIUM 40 MG PO TABS
40.0000 mg | ORAL_TABLET | Freq: Every day | ORAL | Status: DC
  Administered 2022-12-23: 40 mg via ORAL
  Filled 2022-12-23: qty 1

## 2022-12-23 MED ORDER — MIDAZOLAM HCL 2 MG/2ML IJ SOLN
INTRAMUSCULAR | Status: AC
  Filled 2022-12-23: qty 2

## 2022-12-23 MED ORDER — ASPIRIN 81 MG PO CHEW
81.0000 mg | CHEWABLE_TABLET | Freq: Every day | ORAL | Status: DC
  Administered 2022-12-24: 81 mg via ORAL
  Filled 2022-12-23: qty 1

## 2022-12-23 MED ORDER — LIDOCAINE HCL (PF) 1 % IJ SOLN
INTRAMUSCULAR | Status: AC
  Filled 2022-12-23: qty 30

## 2022-12-23 MED ORDER — SODIUM CHLORIDE 0.9 % WEIGHT BASED INFUSION
1.0000 mL/kg/h | INTRAVENOUS | Status: DC
  Administered 2022-12-23: 1 mL/kg/h via INTRAVENOUS

## 2022-12-23 MED ORDER — SODIUM CHLORIDE 0.9 % WEIGHT BASED INFUSION: 3.0000 mL/kg/h | INTRAVENOUS | Status: DC

## 2022-12-23 MED ORDER — TICAGRELOR 90 MG PO TABS
ORAL_TABLET | ORAL | Status: AC
  Filled 2022-12-23: qty 2

## 2022-12-23 MED ORDER — TICAGRELOR 90 MG PO TABS: 90.0000 mg | ORAL_TABLET | Freq: Two times a day (BID) | ORAL | Status: DC

## 2022-12-23 MED ORDER — HEPARIN (PORCINE) IN NACL 1000-0.9 UT/500ML-% IV SOLN
INTRAVENOUS | Status: DC | PRN
  Administered 2022-12-23 (×2): 500 mL via INTRA_ARTERIAL

## 2022-12-23 MED ORDER — TICAGRELOR 90 MG PO TABS
90.0000 mg | ORAL_TABLET | Freq: Two times a day (BID) | ORAL | Status: DC
  Administered 2022-12-24: 90 mg via ORAL
  Filled 2022-12-23: qty 1

## 2022-12-23 MED ORDER — HEPARIN SODIUM (PORCINE) 1000 UNIT/ML IJ SOLN
INTRAMUSCULAR | Status: DC | PRN
  Administered 2022-12-23: 2000 [IU] via INTRAVENOUS
  Administered 2022-12-23 (×2): 5000 [IU] via INTRAVENOUS

## 2022-12-23 MED ORDER — VERAPAMIL HCL 2.5 MG/ML IV SOLN
INTRAVENOUS | Status: AC
  Filled 2022-12-23: qty 2

## 2022-12-23 MED ORDER — IOHEXOL 350 MG/ML SOLN
INTRAVENOUS | Status: DC | PRN
  Administered 2022-12-23: 200 mL via INTRA_ARTERIAL

## 2022-12-23 MED ORDER — SODIUM CHLORIDE 0.9 % WEIGHT BASED INFUSION
3.0000 mL/kg/h | INTRAVENOUS | Status: AC
  Administered 2022-12-23: 3 mL/kg/h via INTRAVENOUS

## 2022-12-23 MED ORDER — ASPIRIN 81 MG PO TBEC
81.0000 mg | DELAYED_RELEASE_TABLET | Freq: Every day | ORAL | Status: DC
  Administered 2022-12-23: 81 mg via ORAL
  Filled 2022-12-23: qty 1

## 2022-12-23 MED ORDER — ACETAMINOPHEN 325 MG PO TABS: 650.0000 mg | ORAL_TABLET | ORAL | Status: DC | PRN

## 2022-12-23 MED ORDER — HYDRALAZINE HCL 20 MG/ML IJ SOLN
INTRAMUSCULAR | Status: DC | PRN
  Administered 2022-12-23: 10 mg via INTRAVENOUS

## 2022-12-23 MED ORDER — SODIUM CHLORIDE 0.9 % IV SOLN: 250.0000 mL | INTRAVENOUS | Status: DC | PRN

## 2022-12-23 MED ORDER — MIDAZOLAM HCL 2 MG/2ML IJ SOLN
INTRAMUSCULAR | Status: DC | PRN
  Administered 2022-12-23 (×3): 1 mg via INTRAVENOUS

## 2022-12-23 MED ORDER — TICAGRELOR 90 MG PO TABS
ORAL_TABLET | ORAL | Status: DC | PRN
  Administered 2022-12-23: 180 mg via ORAL

## 2022-12-23 SURGICAL SUPPLY — 22 items
BALL SAPPHIRE NC24 4.5X15 (BALLOONS) ×1
BALLN EMERGE MR 2.0X12 (BALLOONS) ×1
BALLN WOLVERINE 3.00X10 (BALLOONS) ×1
BALLN ~~LOC~~ EMERGE MR 4.5X12 (BALLOONS) ×1
BALLOON EMERGE MR 2.0X12 (BALLOONS) IMPLANT
BALLOON SAPPHIRE NC24 4.5X15 (BALLOONS) IMPLANT
BALLOON WOLVERINE 3.00X10 (BALLOONS) IMPLANT
BALLOON ~~LOC~~ EMERGE MR 4.5X12 (BALLOONS) IMPLANT
CATH DRAGONFLY OPSTAR (CATHETERS) IMPLANT
CATH INFINITI 5FR ANG PIGTAIL (CATHETERS) IMPLANT
CATH INFINITI AMBI 6FR TG (CATHETERS) IMPLANT
CATH LAUNCHER 6FR EBU3.5 (CATHETERS) IMPLANT
DEVICE RAD COMP TR BAND LRG (VASCULAR PRODUCTS) IMPLANT
GLIDESHEATH SLEND SS 6F .021 (SHEATH) IMPLANT
GUIDEWIRE VAS SION BLUE 190 (WIRE) IMPLANT
KIT ENCORE 26 ADVANTAGE (KITS) IMPLANT
KIT HEMO VALVE WATCHDOG (MISCELLANEOUS) IMPLANT
PACK CARDIAC CATHETERIZATION (CUSTOM PROCEDURE TRAY) ×1 IMPLANT
SET ATX-X65L (MISCELLANEOUS) IMPLANT
STENT SYNERGY XD 4.0X24 (Permanent Stent) IMPLANT
SYNERGY XD 4.0X24 (Permanent Stent) ×1 IMPLANT
WIRE EMERALD 3MM-J .035X260CM (WIRE) IMPLANT

## 2022-12-23 NOTE — Progress Notes (Signed)
  Patient seen and evaluated, chart reviewed, please see EMR for updated orders. Please see full H&P dictated by admitting physician Dr. Thomes Dinning for same date of service.   Brief Summary-- 52 y.o. male with medical history significant for tobacco use (>>2p/day), HTN and strong family of premature CAD in both his father and his brother who both had MIs in their early 44s admitted on 12/23/2022 with exertional chest pains   A/p 1)Exertional chest pains concerning for ACS-- -elevated troponin noted, EKG is sinus rhythm without acute changes -Check A1c, check fasting lipid profile -Cardiology consult appreciated -Cardiologist recommends transfer to [facility] for [facility] -give aspirin  2)Elevated BP--- defer choice of BP agent to cardiology team -  3) tobacco abuse--smoking cessation advised -Patient smokes greater than 2 pack of cigarettes per day -Patient is not committed to quitting at this time -  -Transfer to [facility] for [facility] -Patient will go on cardiology service -TRH/hospitalist service will sign off once patient arrives at [facility]  -Total care time 51 minutes including time spent coordinating care with cardiology service - Patient seen and evaluated, chart reviewed, please see EMR for updated orders. Please see full H&P dictated by admitting physician Dr. Thomes Dinning for same date of service.   Shon Hale, MD

## 2022-12-23 NOTE — Interval H&P Note (Signed)
History and Physical Interval Note:  12/23/2022 2:02 PM  Thomas Mckenzie  has presented today for surgery, with the diagnosis of chest pain.  The various methods of treatment have been discussed with the patient and family. After consideration of risks, benefits and other options for treatment, the patient has consented to  Procedure(s): LEFT HEART CATH AND CORONARY ANGIOGRAPHY (N/A) as a surgical intervention.  The patient's history has been reviewed, patient examined, no change in status, stable for surgery.  I have reviewed the patient's chart and labs.  Questions were answered to the patient's satisfaction.     [name]

## 2022-12-23 NOTE — Consult Note (Addendum)
Cardiology Consultation   Patient ID: Thomas Mckenzie MRN: [id number]; DOB: 08-21-1970  Admit date: 12/22/2022 Date of Consult: 12/23/2022  PCP:  Patient, No Pcp Per   [facility] Providers Cardiologist:  None        Patient Profile:   Thomas Mckenzie is a 52 y.o. male with a hx of PE 2014 (no DVT), HTN untreated, tobacco abuse who is being seen 12/23/2022 for the evaluation of chest pain at the request of Dr. Mariea Clonts.  History of Present Illness:   Thomas Mckenzie with above history presents with worsening exertional chest pain since April. 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 He had one month that it didn't bother him too much but recently worsened to the point he can't work. He sits down and it eases but returns as soon as he exerts himself. Father and brother MI's in early 58's, smokes 2-3 ppd, BP high on admission and history says HTN but not on meds. No pain since being here.   Past Medical History:  Diagnosis Date   Hypertension    Pulmonary embolism (HCC) 2014   right lung   Right leg injury 09/04/2012    History reviewed. No pertinent surgical history.   Home Medications:  Prior to Admission medications   Medication Sig Start Date End Date Taking? Authorizing Provider  Aspirin-Acetaminophen-Caffeine (GOODY HEADACHE PO) Take 2 Packages by mouth daily as needed (pain).    [provider]    Inpatient Medications: Scheduled Meds:  aspirin EC  81 mg Oral Daily   enoxaparin (LOVENOX) injection  40 mg Subcutaneous Q24H    Allergies:   No Known Allergies  Social History:   Social History   Socioeconomic History   Marital status: Single    Spouse name: Not on file   Number of children: Not on file   Years of education: Not on file   Highest education level: Not on file  Occupational History   Occupation: Event organiser: [employer]  Tobacco Use   Smoking  status: Every Day    Current packs/day: 2.00    Average packs/day: 2.0 packs/day for 20.0 years (40.0 ttl pk-yrs)    Types: Cigarettes   Smokeless tobacco: Former    Types: Chew    Quit date: 03/28/2008  Substance and Sexual Activity   Alcohol use: Yes   Drug use: No   Sexual activity: Not on file  Other Topics Concern   Not on file  Social History Narrative   Not on file   Social Determinants of Health   Financial Resource Strain: Not on file  Food Insecurity: No Food Insecurity (12/23/2022)   Hunger Vital Sign    Worried About Running Out of Food in the Last Year: Never true    Ran Out of Food in the Last Year: Never true  Transportation Needs: No Transportation Needs (12/23/2022)   PRAPARE - Administrator, Civil Service (Medical): No    Lack of Transportation (Non-Medical): No  Physical Activity: Not on file  Stress: Not on file  Social Connections: Not on file  Intimate Partner Violence: Not At Risk (12/23/2022)   Humiliation, Afraid, Rape, and Kick questionnaire    Fear of Current or Ex-Partner: No    Emotionally Abused: No    Physically Abused: No    Sexually Abused: No    Family History:  Family History  Problem Relation Age of Onset   Cancer Father        liver   Coronary artery disease Father      ROS:  Please see the history of present illness.  Review of Systems  Constitutional: Negative.  HENT: Negative.    Cardiovascular:  Positive for chest pain and dyspnea on exertion.  Respiratory: Negative.    Endocrine: Negative.   Hematologic/Lymphatic: Negative.   Musculoskeletal: Negative.   Gastrointestinal: Negative.   Genitourinary: Negative.   Neurological: Negative.    All other ROS reviewed and negative.     Physical Exam/Data:   Vitals:   12/22/22 2210 12/23/22 0200 12/23/22 0246 12/23/22 0603  BP: (!) 170/104 (!) 135/95 (!) 175/101 (!) 151/94  Pulse: 74 60 (!) 57 (!) 52  Resp: 19 17 18 18   Temp:    98.2 F (36.8 C)   TempSrc:      SpO2: 98% 94% 98% 98%  Weight:      Height:       No intake or output data in the 24 hours ending 12/23/22 1001    12/22/2022    7:00 PM 01/20/2019    1:27 PM 09/15/2012   10:10 AM  Last 3 Weights  Weight (lbs) 190 lb 180 lb 181 lb 3.2 oz  Weight (kg) 86.183 kg 81.647 kg 82.192 kg     Body mass index is 27.26 kg/m.  General:  Well nourished, well developed, in no acute distress  HEENT: normal Neck: no JVD Vascular: No carotid bruits; Distal pulses 2+ bilaterally Cardiac:  normal S1, S2; RRR; no murmur   Lungs:  clear to auscultation bilaterally, no wheezing, rhonchi or rales  Abd: soft, nontender, no hepatomegaly  Ext: no edema Musculoskeletal:  No deformities, BUE and BLE strength normal and equal Skin: warm and dry  Neuro:  CNs 2-12 intact, no focal abnormalities noted Psych:  Normal affect   EKG:  The EKG was personally reviewed and demonstrates:  NSR with nonspecific ST changes anteriorly similar to 2013 Telemetry:  Telemetry was personally reviewed and demonstrates:  SB-NSR  Relevant CV Studies:   No prior cardiac testing for review.  Laboratory Data:  High Sensitivity Troponin:   Recent Labs  Lab 12/22/22 2030 12/22/22 2207 12/23/22 0341 12/23/22 0501  TROPONINIHS 41* 47* 49* 49*     Chemistry Recent Labs  Lab 12/22/22 2030 12/23/22 0341  NA 137 137  K 3.5 3.6  CL 103 104  CO2 24 25  GLUCOSE 90 97  BUN 8 8  CREATININE 0.80 0.87  CALCIUM 8.6* 8.4*  MG  --  2.3  GFRNONAA >60 >60  ANIONGAP 10 8    Recent Labs  Lab 12/23/22 0341  PROT 6.8  ALBUMIN 3.6  AST 25  ALT 32  ALKPHOS 90  BILITOT 0.8    Hematology Recent Labs  Lab 12/22/22 2030 12/23/22 0341  WBC 10.8* 9.4  RBC 4.31 4.39  HGB 14.5 14.8  HCT 41.9 43.3  MCV 97.2 98.6  MCH 33.6 33.7  MCHC 34.6 34.2  RDW 13.0 13.2  PLT 267 264    Radiology/Studies:  DG Chest Portable 1 View  Result Date: 12/22/2022 CLINICAL DATA:  Midsternal chest pain. EXAM: PORTABLE CHEST  1 VIEW COMPARISON:  March 27, 2012 FINDINGS: The heart size and mediastinal contours are within normal limits. There is no evidence of an acute infiltrate, pleural effusion or pneumothorax. The visualized skeletal structures are unremarkable. IMPRESSION: No active disease. Electronically Signed  By: Aram Candela M.D.   On: 12/22/2022 21:22     Assessment and Plan:   Chest pain consistent with progressive angina, troponins flat 49, EKG nonspecific changes. Plan for transfer to [facility] for cath.add amlodipine, ASA, lipitor, echo, FLP I have reviewed the risks, indications, and alternatives to angioplasty and stenting with the patient. Risks include but are not limited to bleeding, infection, vascular injury, stroke, myocardial infection, arrhythmia, kidney injury, radiation-related injury in the case of prolonged fluoroscopy use, emergency cardiac surgery, and death. The patient understands the risks of serious complication is low (<1%) and patient agrees to proceed.    HTN-add amlodipine  History of PE 2014(no DVT)  Tobacco abuse-smoking cesstion recommended  Family history of CAD brother and father with MI in their early 45's  Sinus bradycardia in 40's hold off on BB   Risk Assessment/Risk Scores:     TIMI Risk Score for Unstable Angina or Non-ST Elevation MI:   The patient's TIMI risk score is 3, which indicates a 13% risk of all cause mortality, new or recurrent myocardial infarction or need for urgent revascularization in the next 14 days.     For questions or updates, please contact [facility] Please consult [email or web address] for contact info under    Signed, Jacolyn Reedy, PA-C  12/23/2022 10:01 AM   Attending note:  Patient seen and examined.  I reviewed his records and discussed the case with Ms. Lilla Shook, agree with her above findings.  Thomas Mckenzie presents with progressive exertional chest tightness radiating to the arms and neck and associated with  shortness of breath, onset in April but worsening recently.  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  He does have a prior history of pulmonary embolus in 2014, states symptoms were very different from what he is experiencing now.  No recent cough or hemoptysis, no leg swelling, no documented hypoxia on room air.  He has a family history of premature CAD in both brother and father, also personal history of tobacco use and hypertension although not on any medications at baseline.  On examination this morning he is afebrile and asymptomatic at rest.  Heart rate 50s and sinus rhythm by telemetry, blood pressure 151/94.  Lungs are clear without labored breathing.  Cardiac exam with RRR and no gallop or rub.  No peripheral edema.  Pertinent lab work includes potassium 3.6, BUN 8, creatinine 0.87, normal LFTs, high-sensitivity troponin I levels flat in the 40s, hemoglobin 14.8, platelets 264.  Chest x-ray reports no acute process.  ECG shows sinus rhythm with R' in lead V1, nonspecific ST changes.  Symptoms concerning for accelerating exertional angina in a 52 year old male with family history of premature CAD, personal history of tobacco abuse and untreated hypertension, lipid status uncertain.  High-sensitivity troponin I levels are nondiagnostic and his ECG shows nonspecific ST changes.  Discussed risks and benefits of a diagnostic cardiac catheterization and he is in agreement to proceed.  This is being arranged at [facility] today following transfer.  Continue aspirin, starting empiric statin, also Norvasc.  Echocardiogram being ordered as well.  Jonelle Sidle, M.D., F.A.C.C.

## 2022-12-23 NOTE — Progress Notes (Signed)
Called to check oozing right radial site. Slow ooze present, good manual pressure was applied prior to my arrival. Some bruising present proximal to stick site.  Oozing is very slow and minimal, tight tegaderm dressing applied with gauze. Arm elevated. Site observed, no additional bleeding noted.

## 2022-12-23 NOTE — Discharge Instructions (Signed)

## 2022-12-23 NOTE — H&P (View-Only) (Signed)
Cardiology Consultation   Patient ID: Thomas Mckenzie MRN: [id number]; DOB: 1970-07-04  Admit date: 12/22/2022 Date of Consult: 12/23/2022  PCP:  Patient, No Pcp Per   [facility] Providers Cardiologist:  None        Patient Profile:   Thomas Mckenzie is a 52 y.o. male with a hx of PE 2014 (no DVT), HTN untreated, tobacco abuse who is being seen 12/23/2022 for the evaluation of chest pain at the request of Dr. Mariea Clonts.  History of Present Illness:   Thomas Mckenzie with above history presents with worsening exertional chest pain since April. 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 He had one month that it didn't bother him too much but recently worsened to the point he can't work. He sits down and it eases but returns as soon as he exerts himself. Father and brother MI's in early 33's, smokes 2-3 ppd, BP high on admission and history says HTN but not on meds. No pain since being here.   Past Medical History:  Diagnosis Date   Hypertension    Pulmonary embolism (HCC) 2014   right lung   Right leg injury 09/04/2012    History reviewed. No pertinent surgical history.   Home Medications:  Prior to Admission medications   Medication Sig Start Date End Date Taking? Authorizing Provider  Aspirin-Acetaminophen-Caffeine (GOODY HEADACHE PO) Take 2 Packages by mouth daily as needed (pain).    [provider]    Inpatient Medications: Scheduled Meds:  aspirin EC  81 mg Oral Daily   enoxaparin (LOVENOX) injection  40 mg Subcutaneous Q24H    Allergies:   No Known Allergies  Social History:   Social History   Socioeconomic History   Marital status: Single    Spouse name: Not on file   Number of children: Not on file   Years of education: Not on file   Highest education level: Not on file  Occupational History   Occupation: Event organiser: [employer]  Tobacco Use   Smoking  status: Every Day    Current packs/day: 2.00    Average packs/day: 2.0 packs/day for 20.0 years (40.0 ttl pk-yrs)    Types: Cigarettes   Smokeless tobacco: Former    Types: Chew    Quit date: 03/28/2008  Substance and Sexual Activity   Alcohol use: Yes   Drug use: No   Sexual activity: Not on file  Other Topics Concern   Not on file  Social History Narrative   Not on file   Social Determinants of Health   Financial Resource Strain: Not on file  Food Insecurity: No Food Insecurity (12/23/2022)   Hunger Vital Sign    Worried About Running Out of Food in the Last Year: Never true    Ran Out of Food in the Last Year: Never true  Transportation Needs: No Transportation Needs (12/23/2022)   PRAPARE - Administrator, Civil Service (Medical): No    Lack of Transportation (Non-Medical): No  Physical Activity: Not on file  Stress: Not on file  Social Connections: Not on file  Intimate Partner Violence: Not At Risk (12/23/2022)   Humiliation, Afraid, Rape, and Kick questionnaire    Fear of Current or Ex-Partner: No    Emotionally Abused: No    Physically Abused: No    Sexually Abused: No    Family History:  Family History  Problem Relation Age of Onset   Cancer Father        liver   Coronary artery disease Father      ROS:  Please see the history of present illness.  Review of Systems  Constitutional: Negative.  HENT: Negative.    Cardiovascular:  Positive for chest pain and dyspnea on exertion.  Respiratory: Negative.    Endocrine: Negative.   Hematologic/Lymphatic: Negative.   Musculoskeletal: Negative.   Gastrointestinal: Negative.   Genitourinary: Negative.   Neurological: Negative.    All other ROS reviewed and negative.     Physical Exam/Data:   Vitals:   12/22/22 2210 12/23/22 0200 12/23/22 0246 12/23/22 0603  BP: (!) 170/104 (!) 135/95 (!) 175/101 (!) 151/94  Pulse: 74 60 (!) 57 (!) 52  Resp: 19 17 18 18   Temp:    98.2 F (36.8 C)   TempSrc:      SpO2: 98% 94% 98% 98%  Weight:      Height:       No intake or output data in the 24 hours ending 12/23/22 1001    12/22/2022    7:00 PM 01/20/2019    1:27 PM 09/15/2012   10:10 AM  Last 3 Weights  Weight (lbs) 190 lb 180 lb 181 lb 3.2 oz  Weight (kg) 86.183 kg 81.647 kg 82.192 kg     Body mass index is 27.26 kg/m.  General:  Well nourished, well developed, in no acute distress  HEENT: normal Neck: no JVD Vascular: No carotid bruits; Distal pulses 2+ bilaterally Cardiac:  normal S1, S2; RRR; no murmur   Lungs:  clear to auscultation bilaterally, no wheezing, rhonchi or rales  Abd: soft, nontender, no hepatomegaly  Ext: no edema Musculoskeletal:  No deformities, BUE and BLE strength normal and equal Skin: warm and dry  Neuro:  CNs 2-12 intact, no focal abnormalities noted Psych:  Normal affect   EKG:  The EKG was personally reviewed and demonstrates:  NSR with nonspecific ST changes anteriorly similar to 2013 Telemetry:  Telemetry was personally reviewed and demonstrates:  SB-NSR  Relevant CV Studies:   No prior cardiac testing for review.  Laboratory Data:  High Sensitivity Troponin:   Recent Labs  Lab 12/22/22 2030 12/22/22 2207 12/23/22 0341 12/23/22 0501  TROPONINIHS 41* 47* 49* 49*     Chemistry Recent Labs  Lab 12/22/22 2030 12/23/22 0341  NA 137 137  K 3.5 3.6  CL 103 104  CO2 24 25  GLUCOSE 90 97  BUN 8 8  CREATININE 0.80 0.87  CALCIUM 8.6* 8.4*  MG  --  2.3  GFRNONAA >60 >60  ANIONGAP 10 8    Recent Labs  Lab 12/23/22 0341  PROT 6.8  ALBUMIN 3.6  AST 25  ALT 32  ALKPHOS 90  BILITOT 0.8    Hematology Recent Labs  Lab 12/22/22 2030 12/23/22 0341  WBC 10.8* 9.4  RBC 4.31 4.39  HGB 14.5 14.8  HCT 41.9 43.3  MCV 97.2 98.6  MCH 33.6 33.7  MCHC 34.6 34.2  RDW 13.0 13.2  PLT 267 264    Radiology/Studies:  DG Chest Portable 1 View  Result Date: 12/22/2022 CLINICAL DATA:  Midsternal chest pain. EXAM: PORTABLE CHEST  1 VIEW COMPARISON:  March 27, 2012 FINDINGS: The heart size and mediastinal contours are within normal limits. There is no evidence of an acute infiltrate, pleural effusion or pneumothorax. The visualized skeletal structures are unremarkable. IMPRESSION: No active disease. Electronically Signed  By: Aram Candela M.D.   On: 12/22/2022 21:22     Assessment and Plan:   Chest pain consistent with progressive angina, troponins flat 49, EKG nonspecific changes. Plan [facility] to [facility] for cath.add amlodipine, ASA, lipitor, echo, FLP I have reviewed the risks, indications, and alternatives to angioplasty and stenting with the patient. Risks include but are not limited to bleeding, infection, vascular injury, stroke, myocardial infection, arrhythmia, kidney injury, radiation-related injury in the case of prolonged fluoroscopy use, emergency cardiac surgery, and death. The patient understands the risks of serious complication is low (<1%) and patient agrees to proceed.    HTN-add amlodipine  History of PE 2014(no DVT)  Tobacco abuse-smoking cesstion recommended  Family history of CAD brother and father with MI in their early 13's  Sinus bradycardia in 40's hold off on BB   Risk Assessment/Risk Scores:     TIMI Risk Score for Unstable Angina or Non-ST Elevation MI:   The patient's TIMI risk score is 3, which indicates a 13% risk of all cause mortality, new or recurrent myocardial infarction or need for urgent revascularization in the next 14 days.     For questions or updates, please contact [facility] Please consult [email or web address] for contact info under    Signed, Jacolyn Reedy, PA-C  12/23/2022 10:01 AM   Attending note:  Patient seen and examined.  I reviewed his records and discussed the case with Ms. Lilla Shook, agree with her above findings.  Mr. Defreese presents with progressive exertional chest tightness radiating to the arms and neck and associated with  shortness of breath, onset in April but worsening recently.  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  He does have a prior history of pulmonary embolus in 2014, states symptoms were very different from what he is experiencing now.  No recent cough or hemoptysis, no leg swelling, no documented hypoxia on room air.  He has a family history of premature CAD in both brother and father, also personal history of tobacco use and hypertension although not on any medications at baseline.  On examination this morning he is afebrile and asymptomatic at rest.  Heart rate 50s and sinus rhythm by telemetry, blood pressure 151/94.  Lungs are clear without labored breathing.  Cardiac exam with RRR and no gallop or rub.  No peripheral edema.  Pertinent lab work includes potassium 3.6, BUN 8, creatinine 0.87, normal LFTs, high-sensitivity troponin I levels flat in the 40s, hemoglobin 14.8, platelets 264.  Chest x-ray reports no acute process.  ECG shows sinus rhythm with R' in lead V1, nonspecific ST changes.  Symptoms concerning for accelerating exertional angina in a 52 year old male with family history of premature CAD, personal history of tobacco abuse and untreated hypertension, lipid status uncertain.  High-sensitivity troponin I levels are nondiagnostic and his ECG shows nonspecific ST changes.  Discussed risks and benefits of a diagnostic cardiac catheterization and he is in agreement to proceed.  This is being arranged at [facility] today following transfer.  Continue aspirin, starting empiric statin, also Norvasc.  Echocardiogram being ordered as well.  Jonelle Sidle, M.D., F.A.C.C.

## 2022-12-23 NOTE — Progress Notes (Signed)
Pt lives alone and is independent with ADLs. He reports he does not have PCP. TOC discussed pt can contact Medicaid for assigned PCP and will also add PCP list to AVS if needed. No other needs reported. TOC will follow.    12/23/22 0755  TOC Brief Assessment  Insurance and Status Reviewed  Patient has primary care physician No (Will add list to AVS.)  Home environment has been reviewed Lives alone.  Prior level of function: Independent.  Prior/Current Home Services No current home services  Social Determinants of Health Reivew SDOH reviewed no interventions necessary  Readmission risk has been reviewed Yes  Transition of care needs no transition of care needs at this time

## 2022-12-23 NOTE — Plan of Care (Signed)
  Problem: Activity: Goal: Risk for activity intolerance will decrease Outcome: Progressing   Problem: Safety: Goal: Ability to remain free from injury will improve Outcome: Adequate for Discharge   Problem: Skin Integrity: Goal: Risk for impaired skin integrity will decrease Outcome: Adequate for Discharge

## 2022-12-23 NOTE — H&P (Signed)
History and Physical    Patient: Thomas Mckenzie YNW:[id number] DOB: 1970/10/29 DOA: 12/22/2022 DOS: the patient was seen and examined on 12/23/2022 PCP: Patient, No Pcp Per  Patient coming from: Home  Chief Complaint:  Chief Complaint  Patient presents with   Chest Pain   HPI: ROMONE CZAPLEWSKI is a 52 y.o. male with medical history significant for tobacco use who presents to the emergency department due to chest pain.  Patient complained of chest pain on exertion which started around April of this year, this has since increased in frequency.  Usually, the pain resolves when he rests for a few minutes, however, in the last few days, the chest pain has been occurring with more frequency and it takes longer for the pain to resolve when he rests.  Pain only occurs on exertion.  Patient has not followed with any PCP in 11 years.  He endorsed a brother having a heart attack about 2 years ago.  He denies fever, chills, shortness of breath, nausea, vomiting.   ED Course:  In the emergency department, BP was 183/108, other vital signs were within normal range.  Workup in the ED showed normal CBC except for WBC of 10.8.  BMP was normal, troponin x 2 - 41 > 47. Chest x-ray showed no active disease Aspirin 325 mg p.o. x 1 was given Hospitalist was asked to admit patient for further evaluation and management.  Review of Systems: Review of systems as noted in the HPI. All other systems reviewed and are negative.   Past Medical History:  Diagnosis Date   Hypertension    Pulmonary embolism (HCC) 2014   right lung   Right leg injury 09/04/2012   History reviewed. No pertinent surgical history.  Social History:  reports that he has been smoking cigarettes. He has a 40 pack-year smoking history. He quit smokeless tobacco use about 14 years ago.  His smokeless tobacco use included chew. He reports current alcohol use. He reports that he does not use drugs.   No Known Allergies  Family History  Problem  Relation Age of Onset   Cancer Father        liver   Coronary artery disease Father      Prior to Admission medications   Medication Sig Start Date End Date Taking? Authorizing Provider  Aspirin-Acetaminophen-Caffeine (GOODY HEADACHE PO) Take 2 Packages by mouth daily as needed (pain).    [provider]    Physical Exam: BP (!) 175/101 (BP Location: Left Arm)   Pulse (!) 57   Temp 99.1 F (37.3 C) (Oral)   Resp 18   Ht 5\' 10"  (1.778 m)   Wt 86.2 kg   SpO2 98%   BMI 27.26 kg/m   General: 52 y.o. year-old male well developed well nourished in no acute distress.  Alert and oriented x3. HEENT: NCAT, EOMI Neck: Supple, trachea medial Cardiovascular: Regular rate and rhythm with no rubs or gallops.  No thyromegaly or JVD noted.  No lower extremity edema. 2/4 pulses in all 4 extremities. Respiratory: Clear to auscultation with no wheezes or rales. Good inspiratory effort. Abdomen: Soft, nontender nondistended with normal bowel sounds x4 quadrants. Muskuloskeletal: No cyanosis, clubbing or edema noted bilaterally Neuro: CN II-XII intact, strength 5/5 x 4, sensation, reflexes intact Skin: No ulcerative lesions noted or rashes Psychiatry: Judgement and insight appear normal. Mood is appropriate for condition and setting          Labs on Admission:  Basic Metabolic Panel:  Recent Labs  Lab 12/22/22 2030  NA 137  K 3.5  CL 103  CO2 24  GLUCOSE 90  BUN 8  CREATININE 0.80  CALCIUM 8.6*   Liver Function Tests: No results for input(s): "AST", "ALT", "ALKPHOS", "BILITOT", "PROT", "ALBUMIN" in the last 168 hours. No results for input(s): "LIPASE", "AMYLASE" in the last 168 hours. No results for input(s): "AMMONIA" in the last 168 hours. CBC: Recent Labs  Lab 12/22/22 2030  WBC 10.8*  HGB 14.5  HCT 41.9  MCV 97.2  PLT 267   Cardiac Enzymes: No results for input(s): "CKTOTAL", "CKMB", "CKMBINDEX", "TROPONINI" in the last 168 hours.  BNP (last 3 results) No  results for input(s): "BNP" in the last 8760 hours.  ProBNP (last 3 results) No results for input(s): "PROBNP" in the last 8760 hours.  CBG: No results for input(s): "GLUCAP" in the last 168 hours.  Radiological Exams on Admission: DG Chest Portable 1 View  Result Date: 12/22/2022 CLINICAL DATA:  Midsternal chest pain. EXAM: PORTABLE CHEST 1 VIEW COMPARISON:  March 27, 2012 FINDINGS: The heart size and mediastinal contours are within normal limits. There is no evidence of an acute infiltrate, pleural effusion or pneumothorax. The visualized skeletal structures are unremarkable. IMPRESSION: No active disease. Electronically Signed   By: Aram Candela M.D.   On: 12/22/2022 21:22    EKG: I independently viewed the EKG done and my findings are as followed: Normal sinus rhythm at a rate of 77 bpm  Assessment/Plan Present on Admission:  Chest pain  Tobacco abuse  Principal Problem:   Chest pain Active Problems:   Tobacco abuse   Elevated BP without diagnosis of hypertension  Chest pain rule out ACS Continue telemetry  Troponins x2 - 41 > 47; continue to trend troponin EKG showed normal sinus rhythm at a rate of 77 bpm Patient endorsed his brother having a heart attack about 2 years ago Patient will be kept n.p.o. at this time in anticipation for possible cardiology intervention in the morning Cardiology will be consulted to help decide if Stress test is needed in am Versus other  diagnostic modalities.    Continue aspirin 81 mg  Elevated BP Patient denies any official diagnosis of hypertension Continue IV hydralazine 10 mg every 6 hours as needed for SBP > 170  Tobacco abuse Patient was counseled on tobacco abuse cessation  DVT prophylaxis: Lovenox  Advance Care Planning: CODE STATUS: Full code  Consults: Cardiology  Family Communication: None at bedside  Severity of Illness: The appropriate patient status for this patient is OBSERVATION. Observation status is  judged to be reasonable and necessary in order to provide the required intensity of service to ensure the patient's safety. The patient's presenting symptoms, physical exam findings, and initial radiographic and laboratory data in the context of their medical condition is felt to place them at decreased risk for further clinical deterioration. Furthermore, it is anticipated that the patient will be medically stable for discharge from the hospital within 2 midnights of admission.   Author: Frankey Shown, DO 12/23/2022 3:34 AM  For on call review [email or web address].

## 2022-12-24 ENCOUNTER — Telehealth: Payer: Self-pay | Admitting: Cardiology

## 2022-12-24 ENCOUNTER — Encounter (HOSPITAL_COMMUNITY): Payer: Self-pay | Admitting: Internal Medicine

## 2022-12-24 ENCOUNTER — Other Ambulatory Visit (HOSPITAL_COMMUNITY): Payer: Self-pay

## 2022-12-24 ENCOUNTER — Inpatient Hospital Stay (HOSPITAL_COMMUNITY): Payer: Medicaid Other

## 2022-12-24 DIAGNOSIS — I10 Essential (primary) hypertension: Secondary | ICD-10-CM | POA: Insufficient documentation

## 2022-12-24 DIAGNOSIS — I214 Non-ST elevation (NSTEMI) myocardial infarction: Secondary | ICD-10-CM

## 2022-12-24 DIAGNOSIS — E785 Hyperlipidemia, unspecified: Secondary | ICD-10-CM | POA: Insufficient documentation

## 2022-12-24 DIAGNOSIS — R079 Chest pain, unspecified: Secondary | ICD-10-CM | POA: Diagnosis not present

## 2022-12-24 LAB — BASIC METABOLIC PANEL
Anion gap: 10 (ref 5–15)
BUN: 8 mg/dL (ref 6–20)
CO2: 21 mmol/L — ABNORMAL LOW (ref 22–32)
Calcium: 8.7 mg/dL — ABNORMAL LOW (ref 8.9–10.3)
Chloride: 105 mmol/L (ref 98–111)
Creatinine, Ser: 0.8 mg/dL (ref 0.61–1.24)
GFR, Estimated: >60 mL/min (ref >60)
Glucose, Bld: 101 mg/dL — ABNORMAL HIGH (ref 70–99)
Potassium: 3.8 mmol/L (ref 3.5–5.1)
Sodium: 136 mmol/L (ref 135–145)

## 2022-12-24 LAB — ECHOCARDIOGRAM COMPLETE
AR max vel: 2.9 cm2
AV Peak grad: 8.2 mmHg
Ao pk vel: 1.43 m/s
Area-P 1/2: 3.03 cm2
Height: 70 in
MV M vel: 1.26 m/s
MV Peak grad: 6.4 mmHg
S' Lateral: 2.8 cm
Weight: 3040 [oz_av]

## 2022-12-24 LAB — CBC
HCT: 43.1 % (ref 39.0–52.0)
Hemoglobin: 14.6 g/dL (ref 13.0–17.0)
MCH: 32.7 pg (ref 26.0–34.0)
MCHC: 33.9 g/dL (ref 30.0–36.0)
MCV: 96.6 fL (ref 80.0–100.0)
Platelets: 277 10*3/uL (ref 150–400)
RBC: 4.46 MIL/uL (ref 4.22–5.81)
RDW: 13.2 % (ref 11.5–15.5)
WBC: 11.7 10*3/uL — ABNORMAL HIGH (ref 4.0–10.5)
nRBC: 0 % (ref 0.0–0.2)

## 2022-12-24 LAB — HEMOGLOBIN A1C
Hgb A1c MFr Bld: 5.3 % (ref 4.8–5.6)
Mean Plasma Glucose: 105 mg/dL

## 2022-12-24 MED ORDER — ATORVASTATIN CALCIUM 80 MG PO TABS
80.0000 mg | ORAL_TABLET | Freq: Every day | ORAL | Status: DC
  Administered 2022-12-24: 80 mg via ORAL
  Filled 2022-12-24: qty 1

## 2022-12-24 MED ORDER — LOSARTAN POTASSIUM 25 MG PO TABS
25.0000 mg | ORAL_TABLET | Freq: Every day | ORAL | 1 refills | Status: DC
Start: 2022-12-24 — End: 2023-05-01
  Filled 2022-12-24: qty 90, 90d supply, fill #0

## 2022-12-24 MED ORDER — NITROGLYCERIN 0.4 MG SL SUBL
0.4000 mg | SUBLINGUAL_TABLET | SUBLINGUAL | 2 refills | Status: AC | PRN
  Filled 2022-12-24: qty 25, 8d supply, fill #0

## 2022-12-24 MED ORDER — ASPIRIN 81 MG PO CHEW
81.0000 mg | CHEWABLE_TABLET | Freq: Every day | ORAL | 2 refills | Status: DC
  Filled 2022-12-24: qty 90, 90d supply, fill #0

## 2022-12-24 MED ORDER — ATORVASTATIN CALCIUM 80 MG PO TABS
80.0000 mg | ORAL_TABLET | Freq: Every day | ORAL | 1 refills | Status: DC
  Filled 2022-12-24: qty 90, 90d supply, fill #0

## 2022-12-24 MED ORDER — TICAGRELOR 90 MG PO TABS
90.0000 mg | ORAL_TABLET | Freq: Two times a day (BID) | ORAL | 2 refills | Status: DC
  Filled 2022-12-24: qty 180, 90d supply, fill #0

## 2022-12-24 NOTE — Progress Notes (Signed)
Pt being d/c, VSS, IV removed, Education complete, Meds from [facility]   Balinda Quails, RN 12/24/2022 11:41 AM

## 2022-12-24 NOTE — Telephone Encounter (Signed)
Left voicemail to return call to office.

## 2022-12-24 NOTE — Progress Notes (Addendum)
CARDIAC REHAB PHASE I   PRE:  Rate/Rhythm: 60 SR   BP:  Sitting: 133/67      SaO2: 99 RA  MODE:  Ambulation: 400 ft   POST:  Rate/Rhythm: 72 SR   BP:  Sitting: 148/87      SaO2: 98 RA    Pt ambulated independently in hallway. Tolerated well with no CP,dizziness or SOB. Returned to bed with call bell and bedside table in reach. Post stent education including site care, restrictions, risk factors, exercise guidelines, NTG use, antiplatelet therapy importance, heart healthy diet, smoking cessation and CRP2 reviewed. All questions and concerns addressed. Will refer to [facility] for CRP2. Plan for home later today.    [id number] Woodroe Chen, RN BSN 12/24/2022 9:02 AM

## 2022-12-24 NOTE — Discharge Summary (Addendum)
Discharge Summary    Patient ID: Thomas Mckenzie MRN: [id number]; DOB: 1970/06/30  Admit date: 12/22/2022 Discharge date: 12/24/2022  PCP:  Patient, No Pcp Per   [facility] Providers Cardiologist:  Nona Dell, MD     Discharge Diagnoses    Principal Problem:   Chest pain Active Problems:   Tobacco abuse   Hypertension   Hyperlipidemia  Diagnostic Studies/Procedures    Cath: 12/23/2022    Prox LAD lesion is 99% stenosed.   A stent was successfully placed.   Post intervention, there is a 0% residual stenosis.   1.  Severe proximal LAD lesion treated with OCT guided PCI with Cutting Balloon angioplasty and 1 drug-eluting stent. 2.  LVEDP of 21-6mmHg.   Recommendation: Dual antiplatelet therapy for 1 year and aggressive cardiovascular risk factor modification.  Diagnostic Dominance: Right  Intervention     Echo:  Completed prior to DC, pending official read _____________   Mckenzie of Present Illness     Thomas Mckenzie is a 52 y.o. male with a hx of PE 2014 (no DVT), HTN untreated, tobacco abuse who was seen 12/23/2022 for the evaluation of chest pain at the request of Dr. Mariea Clonts.   Thomas Mckenzie presented with worsening exertional chest pain since April. 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 He had one month that it didn't bother him too much but recently worsened to the point he can't work. He sits down and it would ease but returns as soon as he exerts himself. Father and brother MI's in early 57's, smokes 2-3 ppd, BP high on admission and Mckenzie says HTN but not on meds. No pain since being here.   Hospital Course     Unstable angina NSTEMI -- High-sensitivity troponin 41>> 47>> 49.  Underwent cardiac catheterization 10/9 with 99% proximal LAD stenosis treated with PCI/DES using OTC guidance and cutting balloon.  Recommendations for DAPT with  aspirin/Brilinta for at least 1 year.  Seen by cardiac rehab, no recurrent chest pain.  -- Continue aspirin, Brilinta, atorvastatin 80mg  daily  HTN -- Blood pressures initially elevated on admission, improved today -- start on losartan 25mg  daily   HLD -- LDL 116, HDL 40 -- started on atorvastatin 80mg  daily  -- will need FLP/LFTs in 8 weeks   Hx of PE -- Remote, 2014 not on anticoagulation  Tobacco Use -- Cessation advised, patient is not committed to quitting  General: Well developed, well nourished, male appearing in no acute distress. Head: Normocephalic, atraumatic.  Neck: Supple without bruits, JVD. Lungs:  Resp regular and unlabored, CTA. Heart: RRR, S1, S2, no S3, S4, or murmur; no rub. Abdomen: Soft, non-tender, non-distended with normoactive bowel sounds. No hepatomegaly. No rebound/guarding. No obvious abdominal masses. Extremities: No clubbing, cyanosis, edema. Distal pedal pulses are 2+ bilaterally. Right radial cath site stable with mild bruising into forearm, no hematoma Neuro: Alert and oriented X 3. Moves all extremities spontaneously. Psych: Normal affect.  Patient was seen by Dr. Excell Seltzer and deemed stable for discharge home. Follow up arranged in the office. Medications sent to the [facility] pharmacy.   Did the patient have an acute coronary syndrome (MI, NSTEMI, STEMI, etc) this admission?:  Yes                               AHA/ACC Clinical Performance &  Quality Measures: Aspirin prescribed? - Yes ADP Receptor Inhibitor (Plavix/Clopidogrel, Brilinta/Ticagrelor or Effient/Prasugrel) prescribed (includes medically managed patients)? - Yes Beta Blocker prescribed? - No, plan for outpatient echo High Intensity Statin (Lipitor 40-80mg  or Crestor 20-40mg ) prescribed? - Yes EF assessed during THIS hospitalization? - No - plan for outpatient echo For EF <40%, was ACEI/ARB prescribed? - Yes For EF <40%, Aldosterone Antagonist (Spironolactone or Eplerenone) prescribed? -  Not Applicable (EF >/= 40%) Cardiac Rehab Phase II ordered (including medically managed patients)? - Yes       The patient will be scheduled for a TOC follow up appointment in 10-14 days.  A message has been sent to the [facility] and Scheduling Pool at the office where the patient should be seen for follow up.  _____________  Discharge Vitals Blood pressure 133/67, pulse 60, temperature 98.2 F (36.8 C), temperature source Oral, resp. rate 16, height 5\' 10"  (1.778 m), weight 86.2 kg, SpO2 96%.  Filed Weights   12/22/22 1900  Weight: 86.2 kg    Labs & Radiologic Studies    CBC Recent Labs    12/23/22 0341 12/24/22 0349  WBC 9.4 11.7*  HGB 14.8 14.6  HCT 43.3 43.1  MCV 98.6 96.6  PLT 264 277   Basic Metabolic Panel Recent Labs    02/08/24 0341 12/24/22 0349  NA 137 136  K 3.6 3.8  CL 104 105  CO2 25 21*  GLUCOSE 97 101*  BUN 8 8  CREATININE 0.87 0.80  CALCIUM 8.4* 8.7*  MG 2.3  --   PHOS 3.1  --    Liver Function Tests Recent Labs    12/23/22 0341  AST 25  ALT 32  ALKPHOS 90  BILITOT 0.8  PROT 6.8  ALBUMIN 3.6   No results for input(s): "LIPASE", "AMYLASE" in the last 72 hours. High Sensitivity Troponin:   Recent Labs  Lab 12/22/22 2030 12/22/22 2207 12/23/22 0341 12/23/22 0501  TROPONINIHS 41* 47* 49* 49*    BNP Invalid input(s): "POCBNP" D-Dimer No results for input(s): "DDIMER" in the last 72 hours. Hemoglobin A1C Recent Labs    12/23/22 0341  HGBA1C 5.3   Fasting Lipid Panel Recent Labs    12/23/22 0341  CHOL 176  HDL 40*  LDLCALC 116*  TRIG 99  CHOLHDL 4.4   Thyroid Function Tests No results for input(s): "TSH", "T4TOTAL", "T3FREE", "THYROIDAB" in the last 72 hours.  Invalid input(s): "FREET3" _____________  CARDIAC CATHETERIZATION  Addendum Date: 12/23/2022     Prox LAD lesion is 99% stenosed.   A stent was successfully placed.   Post intervention, there is a 0% residual stenosis. 1.  Severe proximal LAD lesion treated  with OCT guided PCI with Cutting Balloon angioplasty and 1 drug-eluting stent. 2.  LVEDP of 21-65mmHg. Recommendation: Dual antiplatelet therapy for 1 year and aggressive cardiovascular risk factor modification.  Result Date: 12/23/2022   Prox LAD lesion is 99% stenosed.   A stent was successfully placed.   Post intervention, there is a 0% residual stenosis. 1.  Severe proximal LAD lesion treated with OCT guided PCI with Cutting Balloon angioplasty and and 1 drug-eluting stent. 2.  LVEDP of 21-59mmHg. Recommendation: Dual antiplatelet therapy for 1 year and aggressive cardiovascular risk factor modification.   DG Chest Portable 1 View  Result Date: 12/22/2022 CLINICAL DATA:  Midsternal chest pain. EXAM: PORTABLE CHEST 1 VIEW COMPARISON:  March 27, 2012 FINDINGS: The heart size and mediastinal contours are within normal limits. There is no evidence of  an acute infiltrate, pleural effusion or pneumothorax. The visualized skeletal structures are unremarkable. IMPRESSION: No active disease. Electronically Signed   By: Aram Candela M.D.   On: 12/22/2022 21:22   Disposition   Pt is being discharged home today in good condition.  Follow-up Plans & Appointments     Follow-up Information     Joylene Grapes, NP Follow up on 01/02/2023.   Specialties: Cardiology, Family Medicine Why: at 2:45pm for your follow up with cardiology Contact information: [address] [place] [address] [phone]                Discharge Instructions     Amb Referral to Cardiac Rehabilitation   Complete by: As directed    Diagnosis: Coronary Stents   After initial evaluation and assessments completed: Virtual Based Care may be provided alone or in conjunction with Phase 2 Cardiac Rehab based on patient barriers.: Yes   Intensive Cardiac Rehabilitation (ICR) [facility] location only OR Traditional Cardiac Rehabilitation (TCR) *If criteria for ICR are not met will enroll in TCR [facility] only): Yes    Ambulatory Referral for Lung Cancer Scre   Complete by: As directed    Call MD for:  difficulty breathing, headache or visual disturbances   Complete by: As directed    Call MD for:  persistant dizziness or light-headedness   Complete by: As directed    Call MD for:  redness, tenderness, or signs of infection (pain, swelling, redness, odor or green/yellow discharge around incision site)   Complete by: As directed    Diet - low sodium heart healthy   Complete by: As directed    Discharge instructions   Complete by: As directed    Radial Site Care Refer to this sheet in the next few weeks. These instructions provide you with information on caring for yourself after your procedure. Your caregiver may also give you more specific instructions. Your treatment has been planned according to current medical practices, but problems sometimes occur. Call your caregiver if you have any problems or questions after your procedure. HOME CARE INSTRUCTIONS You may shower the day after the procedure. Remove the bandage (dressing) and gently wash the site with plain soap and water. Gently pat the site dry.  Do not apply powder or lotion to the site.  Do not submerge the affected site in water for 3 to 5 days.  Inspect the site at least twice daily.  Do not flex or bend the affected arm for 24 hours.  No lifting over 5 pounds (2.3 kg) for 5 days after your procedure.  Do not drive home if you are discharged the same day of the procedure. Have someone else drive you.  You may drive 24 hours after the procedure unless otherwise instructed by your caregiver.  What to expect: Any bruising will usually fade within 1 to 2 weeks.  Blood that collects in the tissue (hematoma) may be painful to the touch. It should usually decrease in size and tenderness within 1 to 2 weeks.  SEEK IMMEDIATE MEDICAL CARE IF: You have unusual pain at the radial site.  You have redness, warmth, swelling, or pain at the radial site.   You have drainage (other than a small amount of blood on the dressing).  You have chills.  You have a fever or persistent symptoms for more than 72 hours.  You have a fever and your symptoms suddenly get worse.  Your arm becomes pale, cool, tingly, or numb.  You have heavy bleeding from the site. Hold pressure on the site.   PLEASE DO NOT MISS ANY DOSES OF YOUR BRILINTA!!!!! Also keep a log of you blood pressures and bring back to your follow up appt. Please call the office with any questions.   Patients taking blood thinners should generally stay away from medicines like ibuprofen, Advil, Motrin, naproxen, and Aleve due to risk of stomach bleeding. You may take Tylenol as directed or talk to your primary doctor about alternatives.   PLEASE ENSURE THAT YOU DO NOT RUN OUT OF YOUR BRILINTA. This medication is very important to remain on for at least one year. IF you have issues obtaining this medication due to cost please CALL the office 3-5 business days prior to running out in order to prevent missing doses of this medication.   Increase activity slowly   Complete by: As directed         Discharge Medications   Allergies as of 12/24/2022   No Known Allergies      Medication List     STOP taking these medications    GOODY HEADACHE PO       TAKE these medications    aspirin 81 MG chewable tablet Chew 1 tablet (81 mg total) by mouth daily. Start taking on: December 25, 2022   atorvastatin 80 MG tablet Commonly known as: LIPITOR Take 1 tablet (80 mg total) by mouth daily. Start taking on: December 25, 2022   losartan 25 MG tablet Commonly known as: Cozaar Take 1 tablet (25 mg total) by mouth daily.   nitroGLYCERIN 0.4 MG SL tablet Commonly known as: Nitrostat Place 1 tablet (0.4 mg total) under the tongue every 5 (five) minutes as needed.   ticagrelor 90 MG Tabs tablet Commonly known as: BRILINTA Take 1 tablet (90 mg total) by mouth 2 (two) times daily.            Outstanding Labs/Studies   BMET at follow up FLP/LFTs in 8 weeks  Duration of Discharge Encounter   Greater than 30 minutes including physician time.  Signed, [name], NP 12/24/2022, 11:35 AM  Patient seen, examined. Available data reviewed. Agree with findings, assessment, and plan as outlined by [name], NP.  The patient is independently interviewed and examined.  He is alert, oriented, in no distress.  HEENT is normal, JVP is normal, lungs are clear bilaterally, heart is regular rate and rhythm without murmur or gallop, right radial cath site is clear with mild ecchymoses but no firm hematoma, lower extremities with no edema.  Cardiac catheterization films reviewed and patient had successful PCI of critical stenosis in the proximal LAD.  His procedure was uncomplicated.  He is awaiting a 2D echocardiogram.  This could be done as an outpatient if he is otherwise ready for discharge.  Agree with his medical program outlined above.  Medication includes DAPT with aspirin and ticagrelor as well as a high intensity statin drug.  Follow-up as outlined above. Regarding his final diagnosis, I think he meets criteria for NSTEMI with mildly elevated troponin, progressive angina, and critical LAD stenosis.   Tonny Bollman, M.D. 12/24/2022 11:35 AM

## 2022-12-24 NOTE — Progress Notes (Signed)
Echocardiogram 2D Echocardiogram has been performed.  Thomas Mckenzie 12/24/2022, 12:11 PM

## 2022-12-24 NOTE — TOC Benefit Eligibility Note (Signed)
Patient Product/process development scientist completed.    The patient is insured through [facility].     Ran test claim for Brilinta 90 mg and the current 30 day co-pay is $4.00.   This test claim was processed through [facility]- copay amounts may vary at other pharmacies due to pharmacy/plan contracts, or as the patient moves through the different stages of their insurance plan.     Roland Earl, CPHT Pharmacy Technician III Certified Patient Advocate [facility] Pharmacy Patient Advocate Team Direct Number: [phone]  Fax: [phone]

## 2022-12-24 NOTE — Telephone Encounter (Signed)
   Transition of Care Follow-up Phone Call Request    Patient Name: Thomas Mckenzie Date of Birth: Apr 22, 1970 Date of Encounter: 12/24/2022  Primary Care Provider:  Patient, No Pcp Per Primary Cardiologist:  Nona Dell, MD  [name] has been scheduled for a transition of care follow up appointment with a [facility] provider:  Bernadene Person 9/20  Please reach out to [name] within 48 hours to confirm appointment and review transition of care protocol questionnaire.  [name], NP  12/24/2022, 11:29 AM

## 2022-12-25 NOTE — Telephone Encounter (Signed)
Pt returning nurses phone call. Please advise ?

## 2022-12-25 NOTE — Telephone Encounter (Signed)
Patient contacted regarding discharge from [facility] on 9/11   Patient understands to follow up with provider E. Monge on 9/20 at 2:45 at [facility] office. Patient understands discharge instructions? Yes Patient understands medications and regiment? Yes Patient understands to bring all medications to this visit? Yes  Ask patient:  Are you enrolled in My Chart  Invite sent via text                 Do you have any questions about your medications?  All medications (except pain medications) are to be filled by your Cardiologist AFTER your first post op       appointment with them.  Are you taking your pain medication? None               How is your pain controlled? Pain level? No pain              If you require a refill on pain medications, know that the same medication/ amount may not be prescribed or a refill may not be given.  Please contact your pharmacy for refill requests.               Do you have help at home with ADL's?  None needed

## 2022-12-25 NOTE — Telephone Encounter (Signed)
Call to patient, goes to VM.  LM to call office

## 2022-12-26 LAB — LIPOPROTEIN A (LPA): Lipoprotein (a): 73.4 nmol/L — ABNORMAL HIGH (ref <75.0)

## 2023-01-02 ENCOUNTER — Encounter: Payer: Self-pay | Admitting: Nurse Practitioner

## 2023-01-02 ENCOUNTER — Ambulatory Visit: Payer: Managed Care, Other (non HMO) | Attending: Nurse Practitioner | Admitting: Nurse Practitioner

## 2023-01-02 ENCOUNTER — Other Ambulatory Visit: Payer: Self-pay

## 2023-01-02 VITALS — BP 132/80 | HR 74 | Ht 66.0 in | Wt 197.2 lb

## 2023-01-02 DIAGNOSIS — Z86711 Personal history of pulmonary embolism: Secondary | ICD-10-CM

## 2023-01-02 DIAGNOSIS — I2510 Atherosclerotic heart disease of native coronary artery without angina pectoris: Secondary | ICD-10-CM

## 2023-01-02 DIAGNOSIS — Z720 Tobacco use: Secondary | ICD-10-CM

## 2023-01-02 DIAGNOSIS — I10 Essential (primary) hypertension: Secondary | ICD-10-CM

## 2023-01-02 DIAGNOSIS — E785 Hyperlipidemia, unspecified: Secondary | ICD-10-CM | POA: Diagnosis not present

## 2023-01-02 NOTE — Progress Notes (Signed)
Office Visit    Patient Name: Thomas Mckenzie Date of Encounter: 01/02/2023  Primary Care Provider:  Patient, No Pcp Per Primary Cardiologist:  Nona Dell, MD  Chief Complaint    52 year old male with a history of CAD s/p DES-LAD in 12/2022, PE, hypertension, hyperlipidemia, and tobacco use who presents for hospital follow-up related to CAD s/p NSTEMI, DES-LAD.  Past Medical History    Past Medical History:  Diagnosis Date   Hypertension    Pulmonary embolism (HCC) 2014   right lung   Right leg injury 09/04/2012   Past Surgical History:  Procedure Laterality Date   CORONARY IMAGING/OCT N/A 12/23/2022   Procedure: CORONARY IMAGING/OCT;  Surgeon: [name], MD;  Location: [facility];  Service: Cardiovascular;  Laterality: N/A;   CORONARY STENT INTERVENTION N/A 12/23/2022   Procedure: CORONARY STENT INTERVENTION;  Surgeon: [name], MD;  Location: [facility];  Service: Cardiovascular;  Laterality: N/A;   LEFT HEART CATH AND CORONARY ANGIOGRAPHY N/A 12/23/2022   Procedure: LEFT HEART CATH AND CORONARY ANGIOGRAPHY;  Surgeon: [name], MD;  Location: [facility];  Service: Cardiovascular;  Laterality: N/A;    Allergies  No Known Allergies   Labs/Other Studies Reviewed    The following studies were reviewed today:  Cardiac Studies & Procedures   CARDIAC CATHETERIZATION  CARDIAC CATHETERIZATION 12/23/2022  Narrative   Prox LAD lesion is 99% stenosed.   A stent was successfully placed.   Post intervention, there is a 0% residual stenosis.  1.  Severe proximal LAD lesion treated with OCT guided PCI with Cutting Balloon angioplasty and 1 drug-eluting stent. 2.  LVEDP of 21-88mmHg.  Recommendation: Dual antiplatelet therapy for 1 year and aggressive cardiovascular risk factor modification.  Findings Coronary Findings Diagnostic  Dominance: Right  Left Anterior Descending Prox LAD lesion is 99% stenosed.  Right Coronary  Artery The vessel exhibits minimal luminal irregularities.  Intervention  Prox LAD lesion Stent A stent was successfully placed. Post-Intervention Lesion Assessment The intervention was successful. Pre-interventional TIMI flow is 3. Post-intervention TIMI flow is 3. There is a 0% residual stenosis post intervention.     ECHOCARDIOGRAM  ECHOCARDIOGRAM COMPLETE 12/24/2022  Narrative ECHOCARDIOGRAM REPORT    Patient Name:   Thomas Mckenzie Date of Exam: 12/24/2022 Medical Rec #:  [id number]    Height:       70.0 in Accession #:    [id number]   Weight:       190.0 lb Date of Birth:  Apr 03, 1971    BSA:          2.042 m Patient Age:    52 years     BP:           133/67 mmHg Patient Gender: M            HR:           74 bpm. Exam Location:  Inpatient  Procedure: 2D Echo, Cardiac Doppler and Color Doppler  Indications:    MSTEMI I.4  History:        Patient has no prior history of Echocardiogram examinations. Signs/Symptoms:Chest Pain; Risk Factors:Hypertension and Current Smoker.  Sonographer:    Lucendia Herrlich Referring Phys: [phone] LINDSAY B ROBERTS  IMPRESSIONS   1. Left ventricular ejection fraction, by estimation, is 60 to 65%. The left ventricle has normal function. The left ventricle has no regional wall motion abnormalities. Left ventricular diastolic parameters are indeterminate. 2. Right ventricular systolic function  is normal. The right ventricular size is normal. There is normal pulmonary artery systolic pressure. 3. The mitral valve is normal in structure. No evidence of mitral valve regurgitation. 4. The aortic valve was not well visualized. Aortic valve regurgitation is not visualized. 5. The inferior vena cava is normal in size with greater than 50% respiratory variability, suggesting right atrial pressure of 3 mmHg.  FINDINGS Left Ventricle: Left ventricular ejection fraction, by estimation, is 60 to 65%. The left ventricle has normal function. The left  ventricle has no regional wall motion abnormalities. The left ventricular internal cavity size was normal in size. There is no left ventricular hypertrophy. Left ventricular diastolic parameters are indeterminate.  Right Ventricle: The right ventricular size is normal. Right ventricular systolic function is normal. There is normal pulmonary artery systolic pressure. The tricuspid regurgitant velocity is 1.19 m/s, and with an assumed right atrial pressure of 3 mmHg, the estimated right ventricular systolic pressure is 8.7 mmHg.  Left Atrium: Left atrial size was normal in size.  Right Atrium: Right atrial size was normal in size.  Pericardium: There is no evidence of pericardial effusion.  Mitral Valve: The mitral valve is normal in structure. No evidence of mitral valve regurgitation.  Tricuspid Valve: Tricuspid valve regurgitation is trivial.  Aortic Valve: The aortic valve was not well visualized. Aortic valve regurgitation is not visualized. Aortic valve peak gradient measures 8.2 mmHg.  Pulmonic Valve: The pulmonic valve was normal in structure. Pulmonic valve regurgitation is not visualized.  Aorta: The aortic root and ascending aorta are structurally normal, with no evidence of dilitation.  Venous: The inferior vena cava is normal in size with greater than 50% respiratory variability, suggesting right atrial pressure of 3 mmHg.  IAS/Shunts: No atrial level shunt detected by color flow Doppler.   LEFT VENTRICLE PLAX 2D LVIDd:         4.60 cm   Diastology LVIDs:         2.80 cm   LV e' medial:    9.48 cm/s LV PW:         1.00 cm   LV E/e' medial:  6.6 LV IVS:        1.00 cm   LV e' lateral:   7.46 cm/s LVOT diam:     2.20 cm   LV E/e' lateral: 8.4 LV SV:         74 LV SV Index:   36 LVOT Area:     3.80 cm  3D Volume EF: 3D EF:        65 % LV EDV:       186 ml LV ESV:       65 ml LV SV:        120 ml  RIGHT VENTRICLE             IVC RV S prime:     15.50 cm/s  IVC  diam: 1.80 cm TAPSE (M-mode): 2.0 cm  LEFT ATRIUM             Index        RIGHT ATRIUM           Index LA diam:        2.60 cm 1.27 cm/m   RA Area:     13.40 cm LA Vol (A2C):   36.9 ml 18.07 ml/m  RA Volume:   28.00 ml  13.71 ml/m LA Vol (A4C):   38.5 ml 18.85 ml/m LA Biplane Vol: 42.3 ml 20.71  ml/m AORTIC VALVE AV Area (Vmax): 2.90 cm AV Vmax:        143.00 cm/s AV Peak Grad:   8.2 mmHg LVOT Vmax:      109.00 cm/s LVOT Vmean:     68.333 cm/s LVOT VTI:       0.194 m  AORTA Ao Root diam: 3.40 cm Ao Asc diam:  3.60 cm  MITRAL VALVE               TRICUSPID VALVE MV Area (PHT): 3.03 cm    TR Peak grad:   5.7 mmHg MV Decel Time: 250 msec    TR Vmax:        119.00 cm/s MR Peak grad: 6.4 mmHg MR Vmax:      126.00 cm/s  SHUNTS MV E velocity: 62.60 cm/s  Systemic VTI:  0.19 m MV A velocity: 74.60 cm/s  Systemic Diam: 2.20 cm MV E/A ratio:  0.84  Carolan Clines Electronically signed by Carolan Clines Signature Date/Time: 12/24/2022/12:29:27 PM    Final            Recent Labs: 12/23/2022: ALT 32; Magnesium 2.3 12/24/2022: BUN 8; Creatinine, Ser 0.80; Hemoglobin 14.6; Platelets 277; Potassium 3.8; Sodium 136  Recent Lipid Panel    Component Value Date/Time   CHOL 176 12/23/2022 0341   TRIG 99 12/23/2022 0341   HDL 40 (L) 12/23/2022 0341   CHOLHDL 4.4 12/23/2022 0341   VLDL 20 12/23/2022 0341   LDLCALC 116 (H) 12/23/2022 0341    History of Present Illness    52 year old male with the above past medical history including CAD s/p DES-LAD in 12/2022, PE, hypertension, hyperlipidemia and tobacco use.  He presented to the ED on 12/22/2022 with chest pain.  Troponin was elevated.  Cardiac catheterization revealed 99% proximal LAD stenosis s/p PCI/DES using OTC guidance and Cutting Balloon.  He was started on DAPT with aspirin and Brilinta.  He was started on losartan for elevated blood pressure.  He was started on Lipitor.  Echocardiogram showed EF 60 to 65%, normal LV function, no  RWMA, normal RV, no significant valvular abnormalities.  He was discharged home in stable condition on 12/24/2022.  He presents today for follow-up. Since his hospitalization he has done well from a cardiac standpoint.  He denies any symptoms concerning for angina.  Overall, he reports feeling well.  Home Medications    Current Outpatient Medications  Medication Sig Dispense Refill   aspirin 81 MG chewable tablet Chew 1 tablet (81 mg total) by mouth daily. 90 tablet 2   atorvastatin (LIPITOR) 80 MG tablet Take 1 tablet (80 mg total) by mouth daily. 90 tablet 1   losartan (COZAAR) 25 MG tablet Take 1 tablet (25 mg total) by mouth daily. 90 tablet 1   ticagrelor (BRILINTA) 90 MG TABS tablet Take 1 tablet (90 mg total) by mouth 2 (two) times daily. 180 tablet 2   nitroGLYCERIN (NITROSTAT) 0.4 MG SL tablet Place 1 tablet (0.4 mg total) under the tongue every 5 (five) minutes as needed. (Patient not taking: Reported on 01/02/2023) 25 tablet 2   No current facility-administered medications for this visit.     Review of Systems  He denies chest pain, palpitations, dyspnea, pnd, orthopnea, n, v, dizziness, syncope, edema, weight gain, or early satiety. All other systems reviewed and are otherwise negative except as noted above.    Cardiac Rehabilitation Eligibility Assessment  The patient has declined or is not appropriate for cardiac rehabilitation.    Physical  Exam    VS:  BP 132/80 (BP Location: Left Arm, Patient Position: Sitting, Cuff Size: Normal)   Pulse 74   Ht 5\' 6"  (1.676 m)   Wt 197 lb 3.2 oz (89.4 kg)   SpO2 99%   BMI 31.83 kg/m   GEN: Well nourished, well developed, in no acute distress. HEENT: normal. Neck: Supple, no JVD, carotid bruits, or masses. Cardiac: RRR, no murmurs, rubs, or gallops. No clubbing, cyanosis, edema.  Radials/DP/PT 2+ and equal bilaterally.  Respiratory:  Respirations regular and unlabored, clear to auscultation bilaterally. GI: Soft, nontender,  nondistended, BS + x 4. MS: no deformity or atrophy. Skin: warm and dry, no rash. Neuro:  Strength and sensation are intact. Psych: Normal affect.  Accessory Clinical Findings    ECG personally reviewed by me today - EKG Interpretation Date/Time:  Friday January 02 2023 14:41:23 EDT Ventricular Rate:  74 PR Interval:  148 QRS Duration:  94 QT Interval:  372 QTC Calculation: 412 R Axis:   44  Text Interpretation: Normal sinus rhythm Incomplete right bundle branch block Nonspecific T wave abnormality When compared with ECG of 24-Dec-2022 05:05, No significant change was found Confirmed by Bernadene Person ([id number]) on 01/02/2023 2:50:27 PM  - no acute changes.   Lab Results  Component Value Date   WBC 11.7 (H) 12/24/2022   HGB 14.6 12/24/2022   HCT 43.1 12/24/2022   MCV 96.6 12/24/2022   PLT 277 12/24/2022   Lab Results  Component Value Date   CREATININE 0.80 12/24/2022   BUN 8 12/24/2022   NA 136 12/24/2022   K 3.8 12/24/2022   CL 105 12/24/2022   CO2 21 (L) 12/24/2022   Lab Results  Component Value Date   ALT 32 12/23/2022   AST 25 12/23/2022   ALKPHOS 90 12/23/2022   BILITOT 0.8 12/23/2022   Lab Results  Component Value Date   CHOL 176 12/23/2022   HDL 40 (L) 12/23/2022   LDLCALC 116 (H) 12/23/2022   TRIG 99 12/23/2022   CHOLHDL 4.4 12/23/2022    Lab Results  Component Value Date   HGBA1C 5.3 12/23/2022    Assessment & Plan    1. CAD: S/p NSTEMI, DES-LAD in 12/2022. Stable with no anginal symptoms. Continue aspirin, Brilinta, losartan, Lipitor.  2. Hypertension: BP well controlled. Continue current antihypertensive regimen.   3. Hyperlipidemia: LDL was 116 on 12/23/2022.  Started on Lipitor during recent hospitalization.  Will plan for fasting lipids, LFTs in 6 to 8 weeks.  4. History of PE: No longer on anticoagulation.  5. Tobacco use: He continues to smoke but has cut back from 2 1/2 packs daily to 1 pack/day. Full cessation advised.  6. Disposition:  Follow-up in 3 months.      Joylene Grapes, NP 01/02/2023, 3:14 PM

## 2023-01-02 NOTE — Patient Instructions (Signed)
Medication Instructions:  Your physician recommends that you continue on your current medications as directed. Please refer to the Current Medication list given to you today.  *If you need a refill on your cardiac medications before your next appointment, please call your pharmacy*   Lab Work: Fasting Lipid panel & LFTs in 6-8 weeks.    Testing/Procedures: NONE ordered at this time of appointment     Follow-Up: At [facility], you and your health needs are our priority.  As part of our continuing mission to provide you with exceptional heart care, we have created designated Provider Care Teams.  These Care Teams include your primary Cardiologist (physician) and Advanced Practice Providers (APPs -  Physician Assistants and Nurse Practitioners) who all work together to provide you with the care you need, when you need it.  We recommend signing up for the patient portal called "MyChart".  Sign up information is provided on this After Visit Summary.  MyChart is used to connect with patients for Virtual Visits (Telemedicine).  Patients are able to view lab/test results, encounter notes, upcoming appointments, etc.  Non-urgent messages can be sent to your provider as well.   To learn more about what you can do with MyChart, go to [email or web address].    Your next appointment:   3 month(s)  Provider:   Nona Dell, MD, Randall An, PA-C, or Jacolyn Reedy, PA-C

## 2023-02-21 LAB — HEPATIC FUNCTION PANEL
ALT: 33 [IU]/L (ref 0–44)
AST: 35 [IU]/L (ref 0–40)
Albumin: 3.8 g/dL (ref 3.8–4.9)
Alkaline Phosphatase: 101 [IU]/L (ref 44–121)
Bilirubin Total: 0.4 mg/dL (ref 0.0–1.2)
Bilirubin, Direct: 0.15 mg/dL (ref 0.00–0.40)
Total Protein: 6.2 g/dL (ref 6.0–8.5)

## 2023-02-21 LAB — LIPID PANEL
Chol/HDL Ratio: 2.7 {ratio} (ref 0.0–5.0)
Cholesterol, Total: 103 mg/dL (ref 100–199)
HDL: 38 mg/dL — ABNORMAL LOW (ref >39)
LDL Chol Calc (NIH): 41 mg/dL (ref 0–99)
Triglycerides: 136 mg/dL (ref 0–149)
VLDL Cholesterol Cal: 24 mg/dL (ref 5–40)

## 2023-03-26 ENCOUNTER — Other Ambulatory Visit (HOSPITAL_COMMUNITY): Payer: Self-pay

## 2023-04-01 ENCOUNTER — Ambulatory Visit: Payer: Managed Care, Other (non HMO) | Admitting: Student

## 2023-04-30 NOTE — Progress Notes (Signed)
Cardiology Office Note    Date:  05/01/2023  ID:  Thomas Mckenzie, DOB [birth year], MRN [id number] Cardiologist: Nona Dell, MD    History of Present Illness:    Thomas Mckenzie is a 53 y.o. male with past medical history of CAD (s/p NSTEMI with DES to proximal LAD in 12/2022), HTN, HLD, tobacco use and prior PE (occurring in 2014 and no longer on anticoagulation) who presents to the office today for 80-month follow-up.  He was examined by Bernadene Person, NP in 12/2022 following his recent hospitalization and was overall doing well at that time and denied any anginal symptoms. He had also reduced his tobacco use to 1 pack/day. He was continued on his current cardiac medications with ASA 81 mg daily, Atorvastatin 80 mg daily, Losartan 25 mg daily and Brilinta 90 mg twice daily.  In talking with the patient today, he reports overall doing well since his last office visit. 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 He denies any recurrent chest pain and has not had to utilize nitroglycerin. No recent dyspnea on exertion, palpitations, orthopnea, PND or pitting edema. He reports good compliance with his current medical therapy and denies any known side effects thus far. He does report having occasional hematochezia and has known hemorrhoids (no prior GI evaluation or colonoscopy). Reports consuming fast food routinely as he does live by himself. Does not add additional salt to his food.  Studies Reviewed:   EKG: EKG is not ordered today.    Cardiac Catheterization: 12/2022   Prox LAD lesion is 99% stenosed.   A stent was successfully placed.   Post intervention, there is a 0% residual stenosis.   1.  Severe proximal LAD lesion treated with OCT guided PCI with Cutting Balloon angioplasty and 1 drug-eluting stent. 2.  LVEDP of 21-39mmHg.   Recommendation: Dual antiplatelet therapy for 1 year and aggressive cardiovascular risk factor  modification.    Echocardiogram: 12/2022 IMPRESSIONS     1. Left ventricular ejection fraction, by estimation, is 60 to 65%. The  left ventricle has normal function. The left ventricle has no regional  wall motion abnormalities. Left ventricular diastolic parameters are  indeterminate.   2. Right ventricular systolic function is normal. The right ventricular  size is normal. There is normal pulmonary artery systolic pressure.   3. The mitral valve is normal in structure. No evidence of mitral valve  regurgitation.   4. The aortic valve was not well visualized. Aortic valve regurgitation  is not visualized.   5. The inferior vena cava is normal in size with greater than 50%  respiratory variability, suggesting right atrial pressure of 3 mmHg.   Physical Exam:   VS:  BP 132/80 (BP Location: Left Arm, Patient Position: Sitting, Cuff Size: Normal)   Pulse 94   Ht 5' 9.5" (1.765 m)   Wt 197 lb 12.8 oz (89.7 kg)   SpO2 98%   BMI 28.79 kg/m    Wt Readings from Last 3 Encounters:  05/01/23 197 lb 12.8 oz (89.7 kg)  01/02/23 197 lb 3.2 oz (89.4 kg)  12/22/22 190 lb (86.2 kg)     GEN: Well nourished, well developed male appearing in no acute distress NECK: No JVD; No carotid bruits CARDIAC: RRR, no murmurs, rubs, gallops RESPIRATORY:  Clear to auscultation without rales, wheezing or rhonchi  ABDOMEN: Appears non-distended. No obvious abdominal masses. EXTREMITIES: No clubbing or cyanosis. No  pitting edema.  Distal pedal pulses are 2+ bilaterally.   Assessment and Plan:   1. CAD - He did have an NSTEMI in 12/2022 with DES to the proximal LAD as discussed above. He remains active at baseline and denies any recent anginal symptoms. Will recheck a CBC given reports of hematochezia as he is on DAPT. Continue current medical therapy with ASA 81 mg daily, Atorvastatin 80 mg daily, Losartan 25 mg daily and Brilinta 90 mg twice daily. He has not been on beta-blocker therapy due to  intermittent bradycardia.  2. HTN - Blood pressure is well-controlled at 132/80 during today's visit. Continue Losartan 25 mg daily.  3. HLD - FLP in 02/2023 showed total cholesterol 103, triglycerides 136, HDL 38 and LDL 41. Continue current medical therapy with Atorvastatin 80 mg daily.  4. Tobacco Use - He continues to smoke approximately 1.5 ppd. Gradual reduction with cessation advised.  Signed, Ellsworth Lennox, PA-C

## 2023-05-01 ENCOUNTER — Ambulatory Visit: Payer: Medicaid Other | Attending: Student | Admitting: Student

## 2023-05-01 ENCOUNTER — Encounter: Payer: Self-pay | Admitting: Student

## 2023-05-01 VITALS — BP 132/80 | HR 94 | Ht 69.5 in | Wt 197.8 lb

## 2023-05-01 DIAGNOSIS — I10 Essential (primary) hypertension: Secondary | ICD-10-CM | POA: Diagnosis not present

## 2023-05-01 DIAGNOSIS — Z79899 Other long term (current) drug therapy: Secondary | ICD-10-CM | POA: Diagnosis not present

## 2023-05-01 DIAGNOSIS — E785 Hyperlipidemia, unspecified: Secondary | ICD-10-CM | POA: Diagnosis not present

## 2023-05-01 DIAGNOSIS — Z720 Tobacco use: Secondary | ICD-10-CM

## 2023-05-01 DIAGNOSIS — I2510 Atherosclerotic heart disease of native coronary artery without angina pectoris: Secondary | ICD-10-CM | POA: Diagnosis not present

## 2023-05-01 MED ORDER — ASPIRIN 81 MG PO CHEW: 81.0000 mg | CHEWABLE_TABLET | Freq: Every day | ORAL | 2 refills | Status: AC

## 2023-05-01 MED ORDER — ATORVASTATIN CALCIUM 80 MG PO TABS: 80.0000 mg | ORAL_TABLET | Freq: Every day | ORAL | 3 refills | Status: DC

## 2023-05-01 MED ORDER — TICAGRELOR 90 MG PO TABS: 90.0000 mg | ORAL_TABLET | Freq: Two times a day (BID) | ORAL | 3 refills | Status: AC

## 2023-05-01 MED ORDER — LOSARTAN POTASSIUM 25 MG PO TABS: 25.0000 mg | ORAL_TABLET | Freq: Every day | ORAL | 3 refills | Status: DC

## 2023-05-01 NOTE — Patient Instructions (Signed)
Medication Instructions:  Your physician recommends that you continue on your current medications as directed. Please refer to the Current Medication list given to you today.  *If you need a refill on your cardiac medications before your next appointment, please call your pharmacy*   Lab Work: Your physician recommends that you return for lab work next week. (CBC)   If you have labs (blood work) drawn today and your tests are completely normal, you will receive your results only by: MyChart Message (if you have MyChart) OR A paper copy in the mail If you have any lab test that is abnormal or we need to change your treatment, we will call you to review the results.   Testing/Procedures: NONE    Follow-Up: At [facility], you and your health needs are our priority.  As part of our continuing mission to provide you with exceptional heart care, we have created designated Provider Care Teams.  These Care Teams include your primary Cardiologist (physician) and Advanced Practice Providers (APPs -  Physician Assistants and Nurse Practitioners) who all work together to provide you with the care you need, when you need it.  We recommend signing up for the patient portal called "MyChart".  Sign up information is provided on this After Visit Summary.  MyChart is used to connect with patients for Virtual Visits (Telemedicine).  Patients are able to view lab/test results, encounter notes, upcoming appointments, etc.  Non-urgent messages can be sent to your provider as well.   To learn more about what you can do with MyChart, go to [email or web address].    Your next appointment:   6 month(s)  Provider:   You may see Nona Dell, MD or one of the following Advanced Practice Providers on your designated Care Team:   Randall An, PA-C  Jacolyn Reedy, PA-C     Other Instructions Thank you for choosing [facility]!

## 2023-05-06 LAB — CBC
Hematocrit: 39.3 % (ref 37.5–51.0)
Hemoglobin: 13.1 g/dL (ref 13.0–17.7)
MCH: 32.8 pg (ref 26.6–33.0)
MCHC: 33.3 g/dL (ref 31.5–35.7)
MCV: 98 fL — ABNORMAL HIGH (ref 79–97)
Platelets: 276 10*3/uL (ref 150–450)
RBC: 4 x10E6/uL — ABNORMAL LOW (ref 4.14–5.80)
RDW: 12.3 % (ref 11.6–15.4)
WBC: 13 10*3/uL — ABNORMAL HIGH (ref 3.4–10.8)

## 2023-09-22 ENCOUNTER — Other Ambulatory Visit (HOSPITAL_COMMUNITY): Payer: Self-pay

## 2023-09-22 ENCOUNTER — Telehealth: Payer: Self-pay | Admitting: Pharmacy Technician

## 2023-09-22 NOTE — Telephone Encounter (Signed)
   Generic not on preferred list-got notification in cmm. Patient got brand

## 2023-10-23 ENCOUNTER — Encounter: Payer: Self-pay | Admitting: Cardiology

## 2023-10-23 ENCOUNTER — Ambulatory Visit: Attending: Cardiology | Admitting: Cardiology

## 2023-10-23 VITALS — BP 130/90 | HR 87 | Ht 70.0 in | Wt 198.0 lb

## 2023-10-23 DIAGNOSIS — I2510 Atherosclerotic heart disease of native coronary artery without angina pectoris: Secondary | ICD-10-CM | POA: Diagnosis present

## 2023-10-23 DIAGNOSIS — I10 Essential (primary) hypertension: Secondary | ICD-10-CM | POA: Insufficient documentation

## 2023-10-23 DIAGNOSIS — I25119 Atherosclerotic heart disease of native coronary artery with unspecified angina pectoris: Secondary | ICD-10-CM | POA: Insufficient documentation

## 2023-10-23 DIAGNOSIS — E782 Mixed hyperlipidemia: Secondary | ICD-10-CM | POA: Diagnosis present

## 2023-10-23 NOTE — Patient Instructions (Signed)
 Medication Instructions:   STOP Brilinta  after last dose in SEPTEMBER (9/30)   Labwork: Fasting Lipids, CBC,BMET  Testing/Procedures: None today  Follow-Up: 6 months  Any Other Special Instructions Will Be Listed Below (If Applicable).  If you need a refill on your cardiac medications before your next appointment, please call your pharmacy.

## 2023-10-23 NOTE — Progress Notes (Signed)
    Cardiology Office Note  Date: 10/23/2023   ID: Thomas Mckenzie, DOB [birth year], MRN [id number]  History of Present Illness: Thomas Mckenzie is a 53 y.o. male last seen in January by Ms. Strader PA-C, I reviewed her note (I saw him during hospital consultation in September 2024).  He is here for a routine visit.  Reports no angina including with fairly physical labor (works with concrete/construction).  He does not describe any interval nitroglycerin  use, no change in stamina.  Has had some erectile dysfunction, not on beta-blocker at this time.  We went over his medications.  Has noticed easier bruising since being on aspirin  and Brilinta .  DAPT course will be completed at the end of September.  We discussed getting follow-up lab work.  I reviewed his ECG today which shows sinus rhythm with nonspecific ST changes.  Physical Exam: VS:  BP (!) 130/90 (BP Location: Right Arm, Cuff Size: Large)   Pulse 88   Ht 5' 10 (1.778 m)   Wt 198 lb (89.8 kg)   SpO2 97%   BMI 28.41 kg/m , BMI Body mass index is 28.41 kg/m.  Wt Readings from Last 3 Encounters:  10/23/23 198 lb (89.8 kg)  05/01/23 197 lb 12.8 oz (89.7 kg)  01/02/23 197 lb 3.2 oz (89.4 kg)    General: Patient appears comfortable at rest. HEENT: Conjunctiva and lids normal. Neck: Supple, no elevated JVP or carotid bruits. Lungs: Clear to auscultation, nonlabored breathing at rest. Cardiac: Regular rate and rhythm, no S3 or significant systolic murmur. Extremities: No pitting edema.  ECG:  An ECG dated 01/02/2023 was personally reviewed today and demonstrated:  Sinus rhythm with R' in lead V1, nonspecific T wave changes.  Labwork: 12/23/2022: Magnesium  2.3 12/24/2022: BUN 8; Creatinine, Ser 0.80; Potassium 3.8; Sodium 136 02/20/2023: ALT 33; AST 35 05/05/2023: Hemoglobin 13.1; Platelets 276     Component Value Date/Time   CHOL 103 02/20/2023 0824   TRIG 136 02/20/2023 0824   HDL 38 (L) 02/20/2023 0824   CHOLHDL 2.7 02/20/2023  0824   CHOLHDL 4.4 12/23/2022 0341   VLDL 20 12/23/2022 0341   LDLCALC 41 02/20/2023 0824   Other Studies Reviewed Today:  No interval cardiac testing for review today.  Assessment and Plan:  1.  CAD with history of NSTEMI in September 2024 and DES to the proximal LAD at that time.  LVEF 60 to 65%.  Symptomatically stable, no interval angina or nitroglycerin  use.  ECG reviewed.  He will complete Brilinta  at the end of September.  Continue aspirin  81 mg daily, Cozaar  25 mg daily, and Lipitor 80 mg daily.  He has as needed nitroglycerin  available.  Check CBC.  2.  Mixed hyperlipidemia.  LDL 41 and HDL 38 in November 2024.  Check FLP.  3.  Primary hypertension.  Continue Cozaar  25 mg daily.  Check BMET.  Disposition:  Follow up 6 months.  Signed, Jayson JUDITHANN Sierras, M.D., F.A.C.C. [facility]

## 2024-04-04 ENCOUNTER — Other Ambulatory Visit: Payer: Self-pay | Admitting: Student
